# Patient Record
Sex: Female | Born: 1955
Health system: Southern US, Community
[De-identification: ages and names within clinical notes are randomized; demographics above are authoritative.]

## PROBLEM LIST (undated history)

## (undated) DIAGNOSIS — J309 Allergic rhinitis, unspecified: Secondary | ICD-10-CM

## (undated) DIAGNOSIS — T7840XA Allergy, unspecified, initial encounter: Secondary | ICD-10-CM

## (undated) DIAGNOSIS — I10 Essential (primary) hypertension: Secondary | ICD-10-CM

## (undated) DIAGNOSIS — R079 Chest pain, unspecified: Secondary | ICD-10-CM

## (undated) DIAGNOSIS — R1310 Dysphagia, unspecified: Secondary | ICD-10-CM

## (undated) DIAGNOSIS — N289 Disorder of kidney and ureter, unspecified: Secondary | ICD-10-CM

## (undated) DIAGNOSIS — M1990 Unspecified osteoarthritis, unspecified site: Secondary | ICD-10-CM

## (undated) DIAGNOSIS — K5790 Diverticulosis of intestine, part unspecified, without perforation or abscess without bleeding: Secondary | ICD-10-CM

## (undated) DIAGNOSIS — E785 Hyperlipidemia, unspecified: Secondary | ICD-10-CM

## (undated) DIAGNOSIS — F419 Anxiety disorder, unspecified: Secondary | ICD-10-CM

## (undated) DIAGNOSIS — E538 Deficiency of other specified B group vitamins: Secondary | ICD-10-CM

## (undated) HISTORY — DX: Diverticulosis of intestine, part unspecified, without perforation or abscess without bleeding: K57.90

## (undated) HISTORY — PX: OTHER SURGICAL HISTORY: SHX169

## (undated) HISTORY — DX: Anxiety disorder, unspecified: F41.9

## (undated) HISTORY — PX: ABDOMINAL HYSTERECTOMY: SHX81

## (undated) HISTORY — DX: Deficiency of other specified B group vitamins: E53.8

## (undated) HISTORY — PX: APPENDECTOMY: SHX54

## (undated) HISTORY — DX: Allergic rhinitis, unspecified: J30.9

## (undated) HISTORY — DX: Allergy, unspecified, initial encounter: T78.40XA

## (undated) HISTORY — DX: Hyperlipidemia, unspecified: E78.5

## (undated) HISTORY — DX: Essential (primary) hypertension: I10

## (undated) HISTORY — PX: WRIST SURGERY: SHX841

## (undated) HISTORY — PX: CHOLECYSTECTOMY: SHX55

---
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
## 2015-05-16 DIAGNOSIS — J189 Pneumonia, unspecified organism: Secondary | ICD-10-CM | POA: Insufficient documentation

## 2015-05-16 HISTORY — DX: Pneumonia, unspecified organism: J18.9

## 2015-10-14 HISTORY — PX: COLONOSCOPY: SHX174

## 2017-01-04 ENCOUNTER — Other Ambulatory Visit: Payer: Self-pay | Admitting: Nurse Practitioner

## 2017-01-04 DIAGNOSIS — R921 Mammographic calcification found on diagnostic imaging of breast: Secondary | ICD-10-CM

## 2017-01-10 ENCOUNTER — Ambulatory Visit
Admission: RE | Admit: 2017-01-10 | Discharge: 2017-01-10 | Disposition: A | Discharge status: Home / Self Care | Payer: No Typology Code available for payment source | Source: Ambulatory Visit | Attending: Nurse Practitioner | Admitting: Nurse Practitioner

## 2017-01-10 DIAGNOSIS — R921 Mammographic calcification found on diagnostic imaging of breast: Secondary | ICD-10-CM

## 2017-11-30 ENCOUNTER — Encounter: Payer: Self-pay | Admitting: Internal Medicine

## 2019-01-22 ENCOUNTER — Encounter: Payer: Self-pay | Admitting: Cardiology

## 2019-01-22 ENCOUNTER — Ambulatory Visit (INDEPENDENT_AMBULATORY_CARE_PROVIDER_SITE_OTHER): Payer: No Typology Code available for payment source | Admitting: Cardiology

## 2019-01-22 ENCOUNTER — Other Ambulatory Visit: Payer: Self-pay

## 2019-01-22 VITALS — BP 108/62 | HR 64 | Ht 62.0 in | Wt 262.0 lb

## 2019-01-22 DIAGNOSIS — R1310 Dysphagia, unspecified: Secondary | ICD-10-CM

## 2019-01-22 DIAGNOSIS — N289 Disorder of kidney and ureter, unspecified: Secondary | ICD-10-CM

## 2019-01-22 DIAGNOSIS — M1990 Unspecified osteoarthritis, unspecified site: Secondary | ICD-10-CM | POA: Insufficient documentation

## 2019-01-22 DIAGNOSIS — I209 Angina pectoris, unspecified: Secondary | ICD-10-CM | POA: Insufficient documentation

## 2019-01-22 DIAGNOSIS — R079 Chest pain, unspecified: Secondary | ICD-10-CM

## 2019-01-22 DIAGNOSIS — R1314 Dysphagia, pharyngoesophageal phase: Secondary | ICD-10-CM

## 2019-01-22 HISTORY — DX: Chest pain, unspecified: R07.9

## 2019-01-22 HISTORY — DX: Unspecified osteoarthritis, unspecified site: M19.90

## 2019-01-22 HISTORY — DX: Dysphagia, pharyngoesophageal phase: R13.14

## 2019-01-22 HISTORY — DX: Dysphagia, unspecified: R13.10

## 2019-01-22 HISTORY — DX: Morbid (severe) obesity due to excess calories: E66.01

## 2019-01-22 HISTORY — DX: Disorder of kidney and ureter, unspecified: N28.9

## 2019-01-22 MED ORDER — METOPROLOL TARTRATE 50 MG PO TABS: 100.0000 mg | ORAL_TABLET | Freq: Once | ORAL | 0 refills | Status: DC

## 2019-01-22 NOTE — Progress Notes (Signed)
Cardiology Office Note:    Date:  01/22/2019   ID:  Judy Lucas, DOB May 26, 1956, MRN [id number]  PCP:  Nicholos Johns, MD  Cardiologist:  Jenean Lindau, MD   Referring MD: Nicholos Johns, MD    ASSESSMENT:    1. Angina pectoris (Judy Lucas)   2. Renal insufficiency   3. Osteoarthritis, unspecified osteoarthritis type, unspecified site   4. Morbid obesity (Judy Lucas)    PLAN:    In order of problems listed above:  1. Angina pectoris: Her symptoms are very concerning.  She has risk factors for coronary artery disease.  I discussed with her evaluation which is invasive and noninvasive.  I also discussed conventional and CT coronary angiography.  She prefers CT coronary angiography and I discussed details with her at length.  She has an element of renal insufficiency and therefore we will do a repeat Chem-7.  I will also have her hold spironolactone for a day before her procedure.  Benefits and potential risks explained.  Risks to renal function explained in view of renal insufficiency and she vocalized understanding. 2. Essential hypertension: Her blood pressure is stable. 3. Mixed dyslipidemia: This is managed by her primary care physician and she is on statin therapy and diet was discussed.  Risks of obesity was explained and the patient was urged to lose weight 4. She will be seen in follow-up appointment in a month or earlier if she has any concerns.  She knows to go to the nearest emergency room for any significant concerns.   Medication Adjustments/Labs and Tests Ordered: Current medicines are reviewed at length with the patient today.  Concerns regarding medicines are outlined above.  No orders of the defined types were placed in this encounter.  No orders of the defined types were placed in this encounter.    History of Present Illness:    Judy Lucas is a 63 y.o. female who is being seen today for the evaluation of chest discomfort at the request of Nicholos Johns, MD.  Patient is a  pleasant 63 year old female.  She is a Marine scientist by profession.  She mentions to me that she has essential hypertension and dyslipidemia.  She says that she may be exposed to the corona virus in February.  Subsequently she has been treated with multiple courses of antibiotics and is feeling better.  She mentions to me that she when she exerts herself she has chest tightness which goes to the neck.  This happens consistently on exertion.  She also has been noticing some shortness of breath.  At the time of my evaluation, the patient is alert awake oriented and in no distress.  No orthopnea or PND.  She mentions to me that she cannot take nitroglycerin.   Past Medical History:  Diagnosis Date  . Chest pain 01/22/2019  . Dysphagia 01/22/2019  . Osteoarthritis 01/22/2019  . Renal insufficiency 01/22/2019    Past Surgical History:  Procedure Laterality Date  . ABDOMINAL HYSTERECTOMY    . APPENDECTOMY    . CHOLECYSTECTOMY    . feet surgery    . WRIST SURGERY      Current Medications: Current Meds  Medication Sig  . fluticasone (FLONASE) 50 MCG/ACT nasal spray USE TWO SPRAYS IN EACH NOSTRIL EVERY DAY  . loratadine (CLARITIN) 10 MG tablet Take 1 tablet by mouth daily.  . meclizine (ANTIVERT) 12.5 MG tablet Take 12.5 mg by mouth 3 (three) times daily as needed.  . mirabegron ER (MYRBETRIQ) 25 MG TB24 tablet Take 25  mg by mouth daily.  . montelukast (SINGULAIR) 10 MG tablet Take 10 mg by mouth at bedtime.  Marland Kitchen olmesartan (BENICAR) 40 MG tablet Take 40 mg by mouth daily.  Marland Kitchen omeprazole (PRILOSEC) 40 MG capsule Take 40 mg by mouth daily.  . simvastatin (ZOCOR) 10 MG tablet Take 10 mg by mouth daily.  Marland Kitchen spironolactone (ALDACTONE) 50 MG tablet Take 50 mg by mouth 2 (two) times daily.   . [DISCONTINUED] allopurinol (ZYLOPRIM) 300 MG tablet Take 300 mg by mouth daily.  . [DISCONTINUED] Ascorbic Acid (VITAMIN C) 100 MG tablet Take 100 mg by mouth daily.  . [DISCONTINUED] aspirin EC 81 MG tablet Take 81 mg by mouth  daily.  . [DISCONTINUED] Cholecalciferol (VITAMIN D) 50 MCG (2000 UT) tablet Take 2,000 Units by mouth daily.  . [DISCONTINUED] vitamin B-12 (CYANOCOBALAMIN) 500 MCG tablet Take 500 mcg by mouth daily.     Allergies:   Meperidine; Morphine; Thyroid; Furosemide; Nitroglycerin; Penicillin g; and Sulfamethoxazole   Social History   Socioeconomic History  . Marital status: Married    Spouse name: Not on file  . Number of children: Not on file  . Years of education: Not on file  . Highest education level: Not on file  Occupational History  . Not on file  Social Needs  . Financial resource strain: Not on file  . Food insecurity:    Worry: Not on file    Inability: Not on file  . Transportation needs:    Medical: Not on file    Non-medical: Not on file  Tobacco Use  . Smoking status: Never Smoker  . Smokeless tobacco: Never Used  Substance and Sexual Activity  . Alcohol use: Not on file  . Drug use: Not on file  . Sexual activity: Not on file  Lifestyle  . Physical activity:    Days per week: Not on file    Minutes per session: Not on file  . Stress: Not on file  Relationships  . Social connections:    Talks on phone: Not on file    Gets together: Not on file    Attends religious service: Not on file    Active member of club or organization: Not on file    Attends meetings of clubs or organizations: Not on file    Relationship status: Not on file  Other Topics Concern  . Not on file  Social History Narrative  . Not on file     Family History: The patient's family history includes Heart attack in her father and mother; Heart disease in her father and mother; Hyperlipidemia in her brother, father, mother, and sister; Hypertension in her father, mother, and sister; Skin cancer in her father.  ROS:   Please see the history of present illness.    All other systems reviewed and are negative.  EKGs/Labs/Other Studies Reviewed:    The following studies were reviewed today  EKG reveals sinus rhythm and nonspecific ST-T changes   Recent Labs: No results found for requested labs within last 8760 hours.  Recent Lipid Panel No results found for: CHOL, TRIG, HDL, CHOLHDL, VLDL, LDLCALC, LDLDIRECT  Physical Exam:    VS:  BP 108/62 (BP Location: Left Arm, Patient Position: Sitting, Cuff Size: Normal)   Pulse 64   Ht 5\' 2"  (1.575 m)   Wt 262 lb (118.8 kg)   SpO2 98%   BMI 47.92 kg/m     Wt Readings from Last 3 Encounters:  01/22/19 262 lb (118.8 kg)  GEN: Patient is in no acute distress HEENT: Normal NECK: No JVD; No carotid bruits LYMPHATICS: No lymphadenopathy CARDIAC: S1 S2 regular, 2/6 systolic murmur at the apex. RESPIRATORY:  Clear to auscultation without rales, wheezing or rhonchi  ABDOMEN: Soft, non-tender, non-distended MUSCULOSKELETAL:  No edema; No deformity  SKIN: Warm and dry NEUROLOGIC:  Alert and oriented x 3 PSYCHIATRIC:  Normal affect    Signed, Jenean Lindau, MD  01/22/2019 4:06 PM    [facility]

## 2019-01-22 NOTE — Addendum Note (Signed)
Addended by: Beckey Rutter on: 01/22/2019 04:30 PM   Modules accepted: Orders

## 2019-01-22 NOTE — Patient Instructions (Addendum)
Medication Instructions:  Your physician recommends that you continue on your current medications as directed. Please refer to the Current Medication list given to you today.  If you need a refill on your cardiac medications before your next appointment, please call your pharmacy.   Lab work: Your physician recommends that you will have a BMP and CBC drawn today  If you have labs (blood work) drawn today and your tests are completely normal, you will receive your results only by: Marland Kitchen MyChart Message (if you have MyChart) OR . A paper copy in the mail If you have any lab test that is abnormal or we need to change your treatment, we will call you to review the results.  Testing/Procedures: Non-Cardiac CT scanning, (CAT scanning), is a noninvasive, special x-ray that produces cross-sectional images of the body using x-rays and a computer. CT scans help physicians diagnose and treat medical conditions. For some CT exams, a contrast material is used to enhance visibility in the area of the body being studied. CT scans provide greater clarity and reveal more details than regular x-ray exams.  Please arrive at the [facility] main entrance of [facility] at xx:xx AM (30-45 minutes prior to test start time)  [facility] [place], [place] [address] [phone]  Proceed to the [facility] Radiology Department (First Floor).  Please follow these instructions carefully (unless otherwise directed):  On the Night Before the Test: . Be sure to Drink plenty of water. . Do not consume any caffeinated/decaffeinated beverages or chocolate 12 hours prior to your test. . Do not take any antihistamines 12 hours prior to your test.   On the Day of the Test: . Drink plenty of water. Do not drink any water within one hour of the test. . Do not eat any food 4 hours prior to the test. . You may take your regular medications prior to the test.  Take metoprolol (Lopressor)  two hours prior to test.-IF HR is greater than 55 BPM       After the Test: . Drink plenty of water. . After receiving IV contrast, you may experience a mild flushed feeling. This is normal. . On occasion, you may experience a mild rash up to 24 hours after the test. This is not dangerous. If this occurs, you can take Benadryl 25 mg and increase your fluid intake. . If you experience trouble breathing, this can be serious. If it is severe call 911 IMMEDIATELY. If it is mild, please call our office. . If you take any of these medications: Glipizide/Metformin, Avandament, Glucavance, please do not take 48 hours after completing test.  Follow-Up: At [facility], you and your health needs are our priority.  As part of our continuing mission to provide you with exceptional heart care, we have created designated Provider Care Teams.  These Care Teams include your primary Cardiologist (physician) and Advanced Practice Providers (APPs -  Physician Assistants and Nurse Practitioners) who all work together to provide you with the care you need, when you need it. FOLLOW UP IN 30 days with Dr. Geraldo Pitter in the [facility] office  Any Other Special Instructions Will Be Listed Below   Cardiac CT Angiogram  A cardiac CT angiogram is a procedure to look at the heart and the area around the heart. It may be done to help find the cause of chest pains or other symptoms of heart disease. During this procedure, a large X-ray machine, called a CT  scanner, takes detailed pictures of the heart and the surrounding area after a dye (contrast material) has been injected into blood vessels in the area. The procedure is also sometimes called a coronary CT angiogram, coronary artery scanning, or CTA. A cardiac CT angiogram allows the health care provider to see how well blood is flowing to and from the heart. The health care provider will be able to see if there are any problems, such as:  Blockage or narrowing of the  coronary arteries in the heart.  Fluid around the heart.  Signs of weakness or disease in the muscles, valves, and tissues of the heart. Tell a health care provider about:  Any allergies you have. This is especially important if you have had a previous allergic reaction to contrast dye.  All medicines you are taking, including vitamins, herbs, eye drops, creams, and over-the-counter medicines.  Any blood disorders you have.  Any surgeries you have had.  Any medical conditions you have.  Whether you are pregnant or may be pregnant.  Any anxiety disorders, chronic pain, or other conditions you have that may increase your stress or prevent you from lying still. What are the risks? Generally, this is a safe procedure. However, problems may occur, including:  Bleeding.  Infection.  Allergic reactions to medicines or dyes.  Damage to other structures or organs.  Kidney damage from the dye or contrast that is used.  Increased risk of cancer from radiation exposure. This risk is low. Talk with your health care provider about: ? The risks and benefits of testing. ? How you can receive the lowest dose of radiation. What happens before the procedure?  Wear comfortable clothing and remove any jewelry, glasses, dentures, and hearing aids.  Follow instructions from your health care provider about eating and drinking. This may include: ? For 12 hours before the test - avoid caffeine. This includes tea, coffee, soda, energy drinks, and diet pills. Drink plenty of water or other fluids that do not have caffeine in them. Being well-hydrated can prevent complications. ? For 4-6 hours before the test - stop eating and drinking. The contrast dye can cause nausea, but this is less likely if your stomach is empty.  Ask your health care provider about changing or stopping your regular medicines. This is especially important if you are taking diabetes medicines, blood thinners, or medicines to  treat erectile dysfunction. What happens during the procedure?  Hair on your chest may need to be removed so that small sticky patches called electrodes can be placed on your chest. These will transmit information that helps to monitor your heart during the test.  An IV tube will be inserted into one of your veins.  You might be given a medicine to control your heart rate during the test. This will help to ensure that good images are obtained.  You will be asked to lie on an exam table. This table will slide in and out of the CT machine during the procedure.  Contrast dye will be injected into the IV tube. You might feel warm, or you may get a metallic taste in your mouth.  You will be given a medicine (nitroglycerin) to relax (dilate) the arteries in your heart.  The table that you are lying on will move into the CT machine tunnel for the scan.  The person running the machine will give you instructions while the scans are being done. You may be asked to: ? Keep your arms above your head. ?  Hold your breath. ? Stay very still, even if the table is moving.  When the scanning is complete, you will be moved out of the machine.  The IV tube will be removed. The procedure may vary among health care providers and hospitals. What happens after the procedure?  You might feel warm, or you may get a metallic taste in your mouth from the contrast dye.  You may have a headache from the nitroglycerin.  After the procedure, drink water or other fluids to wash (flush) the contrast material out of your body.  Contact a health care provider if you have any symptoms of allergy to the contrast. These symptoms include: ? Shortness of breath. ? Rash or hives. ? A racing heartbeat.  Most people can return to their normal activities right after the procedure. Ask your health care provider what activities are safe for you.  It is up to you to get the results of your procedure. Ask your health care  provider, or the department that is doing the procedure, when your results will be ready. Summary  A cardiac CT angiogram is a procedure to look at the heart and the area around the heart. It may be done to help find the cause of chest pains or other symptoms of heart disease.  During this procedure, a large X-ray machine, called a CT scanner, takes detailed pictures of the heart and the surrounding area after a dye (contrast material) has been injected into blood vessels in the area.  Ask your health care provider about changing or stopping your regular medicines before the procedure. This is especially important if you are taking diabetes medicines, blood thinners, or medicines to treat erectile dysfunction.  After the procedure, drink water or other fluids to wash (flush) the contrast material out of your body. This information is not intended to replace advice given to you by your health care provider. Make sure you discuss any questions you have with your health care provider. Document Released: 07/14/2008 Document Revised: 06/20/2016 Document Reviewed: 06/20/2016 Elsevier Interactive Patient Education  2019 [employer].

## 2019-01-23 LAB — BASIC METABOLIC PANEL
BUN/Creatinine Ratio: 24 (ref 12–28)
BUN: 20 mg/dL (ref 8–27)
CO2: 18 mmol/L — ABNORMAL LOW (ref 20–29)
Calcium: 9.2 mg/dL (ref 8.7–10.3)
Chloride: 103 mmol/L (ref 96–106)
Creatinine, Ser: 0.82 mg/dL (ref 0.57–1.00)
GFR calc Af Amer: 87 mL/min/{1.73_m2} (ref >59)
GFR calc non Af Amer: 76 mL/min/{1.73_m2} (ref >59)
Glucose: 94 mg/dL (ref 65–99)
Potassium: 4.3 mmol/L (ref 3.5–5.2)
Sodium: 139 mmol/L (ref 134–144)

## 2019-01-23 LAB — CBC
Hematocrit: 32.5 % — ABNORMAL LOW (ref 34.0–46.6)
Hemoglobin: 11.1 g/dL (ref 11.1–15.9)
MCH: 32.9 pg (ref 26.6–33.0)
MCHC: 34.2 g/dL (ref 31.5–35.7)
MCV: 96 fL (ref 79–97)
Platelets: 213 10*3/uL (ref 150–450)
RBC: 3.37 x10E6/uL — ABNORMAL LOW (ref 3.77–5.28)
RDW: 13.7 % (ref 11.7–15.4)
WBC: 6.3 10*3/uL (ref 3.4–10.8)

## 2019-01-24 ENCOUNTER — Encounter: Payer: Self-pay | Admitting: Gastroenterology

## 2019-01-24 ENCOUNTER — Telehealth: Payer: Self-pay

## 2019-01-24 DIAGNOSIS — N289 Disorder of kidney and ureter, unspecified: Secondary | ICD-10-CM

## 2019-01-24 NOTE — Telephone Encounter (Signed)
Information relayed to patient, she is in agreement with plan. She will come in within 1 wk of CT to have repeat BMP drawn. No further questions at this time.

## 2019-01-24 NOTE — Telephone Encounter (Signed)
-----   Message from Jenean Lindau, MD sent at 01/23/2019 10:07 AM EDT ----- The results of the study is unremarkable.  Please let me know when her CT coronary angiography is scheduled for.  Please tell the patient to hold Spironolactone the day previous to the CT scan and encourage hydration on the previous day and the day of the CT scan after the test is done.  I would like her to get a Chem-7 3 to 4 days after the CT scan.  Please inform patient. I will discuss in detail at next appointment. Cc  primary care/referring physician Jenean Lindau, MD 01/23/2019 10:06 AM

## 2019-01-29 NOTE — Addendum Note (Signed)
Addended by: Beckey Rutter on: 01/29/2019 08:34 AM   Modules accepted: Orders

## 2019-02-02 ENCOUNTER — Telehealth (HOSPITAL_COMMUNITY): Payer: Self-pay | Admitting: Emergency Medicine

## 2019-02-02 NOTE — Telephone Encounter (Signed)
Unable to leave voicemail, box not set up

## 2019-02-04 ENCOUNTER — Ambulatory Visit (HOSPITAL_COMMUNITY)
Admission: RE | Admit: 2019-02-04 | Discharge: 2019-02-04 | Disposition: A | Discharge status: Home / Self Care | Payer: PRIVATE HEALTH INSURANCE | Source: Ambulatory Visit | Attending: Cardiology | Admitting: Cardiology

## 2019-02-04 ENCOUNTER — Ambulatory Visit (HOSPITAL_COMMUNITY): Admission: RE | Admit: 2019-02-04 | Payer: PRIVATE HEALTH INSURANCE | Source: Ambulatory Visit

## 2019-02-04 ENCOUNTER — Other Ambulatory Visit: Payer: Self-pay

## 2019-02-04 ENCOUNTER — Encounter (HOSPITAL_COMMUNITY): Payer: Self-pay

## 2019-02-04 ENCOUNTER — Telehealth (HOSPITAL_COMMUNITY): Payer: Self-pay | Admitting: Emergency Medicine

## 2019-02-04 DIAGNOSIS — Z006 Encounter for examination for normal comparison and control in clinical research program: Secondary | ICD-10-CM

## 2019-02-04 DIAGNOSIS — I209 Angina pectoris, unspecified: Secondary | ICD-10-CM | POA: Insufficient documentation

## 2019-02-04 MED ORDER — NITROGLYCERIN 0.4 MG SL SUBL
0.8000 mg | SUBLINGUAL_TABLET | Freq: Once | SUBLINGUAL | Status: AC
  Administered 2019-02-04: 14:00:00 0.8 mg via SUBLINGUAL
  Filled 2019-02-04: qty 25

## 2019-02-04 MED ORDER — NITROGLYCERIN 0.4 MG SL SUBL
SUBLINGUAL_TABLET | SUBLINGUAL | Status: AC
  Administered 2019-02-04: 0.8 mg via SUBLINGUAL
  Filled 2019-02-04: qty 2

## 2019-02-04 MED ORDER — IOHEXOL 350 MG/ML SOLN
100.0000 mL | Freq: Once | INTRAVENOUS | Status: AC | PRN
  Administered 2019-02-04: 80 mL via INTRAVENOUS

## 2019-02-04 NOTE — Discharge Instructions (Signed)
What You Need to Know About IV Contrast Material °IV contrast material is most often a fluid that is used with some imaging tests. Contrast material is injected into your body through a vein to help your health care providers see your organs and tissues more clearly. It may be used with: °· X-ray. °· MRI. °· CT. °· Ultrasound. °Contrast material is used when your health care providers need a detailed look at organs, tissues, or blood vessels that may not show up with the standard test. IV contrast may be used for imaging tests that examine: °· Muscles, skin, and fat. °· Breasts. °· Brain. °· Digestive tract. °· Heart. °· Liver. °· Lungs and many other internal organs. °What are the risks of using IV contrast material? °The risks of using IV contrast material include: °· Headache. °· Itching, skin rash, and hives. °· Allergic reactions. °· Nausea and vomiting. °· Wheezing or difficulty breathing. °· Abnormal heart rate. °· Blood pressure changes. °· Throat swelling. °· Kidney damage. °These complications are more likely to occur in people who: °· Have kidney failure. °· Have liver problems. °· Have certain heart problems, including: °? Heart failure. °? Heart attack. °? Heart infection. °? Heart valve problems. °· Abuse alcohol. °· Have allergies or asthma. °· Are dehydrated. °· Have sickle cell anemia or similar problems. °· Have had trouble with IV contrast material in the past. °· Take certain medicines, such as: °? Metformin. °? NSAIDs. °? Beta blockers. °? Interleukin-2. °How do I prepare for my test with IV contrast material? °· Follow instructions from your health care provider about eating or drinking restrictions. °· Ask your health care provider about changing or stopping your regular medicines. This is especially important if you are taking diabetes medicines or blood thinners. °· Tell your health care provider about: °? Any previous illnesses, surgeries, or pre-existing medical conditions. °? Whether you  are pregnant or may be pregnant. °? Whether you are breastfeeding. Most contrast agents are safe for use in breastfeeding women. °· You may have a physical exam to determine any potential risks. °· Ask if you will be given a medicine (sedative) to help you relax during the procedure. If so, plan to have someone take you home after test. °What happens during the test with IV contrast material? ° °· You may be given a sedative to help you relax. °· A needle will be inserted into one of your veins to administer the IV contrast material. °· You may feel warmth or flushing as the material enters your bloodstream. °· You may have a metallic taste in your mouth for a few minutes. °· The needle may cause some discomfort and bruising. °· After the contrast material is in your body, the imaging test will be done. °The procedure may vary among health care providers and hospitals. °What happens after the test with IV contrast material? °· You may be asked to drink water or other fluids to wash (flush) the contrast material out of your body. °· Drink enough fluid to keep your urine pale yellow. °· Do not drive for 24 hours if you received a sedative. °· It is your responsibility to get your test results. Ask your health care provider or the department performing the test when your results will be ready. °Contact a health care provider if: °· You have redness, swelling, or pain near your IV site. °Get help right away if: °· You have an abnormal heart rhythm. °· You have trouble breathing. °· You have: °?   Chest pain. ? Pain in your back, neck, arm, jaw, or stomach. ? Nausea or sweating. ? Hives or a rash.  You start shaking and cannot stop. These symptoms may represent a serious problem that is an emergency. Do not wait to see if the symptoms will go away. Get medical help right away. Call your local emergency services (911 in the [place] Do not drive yourself to the hospital. Summary  IV contrast may be used for imaging  tests to help your health care providers see your organs and tissues more clearly.  Tell your health care provider if you are pregnant or may be pregnant.  During the procedure, you may feel warmth or flushing as the material enters your bloodstream.  After the procedure, drink enough fluid to keep your urine pale yellow. This information is not intended to replace advice given to you by your health care provider. Make sure you discuss any questions you have with your health care provider. Document Released: 07/20/2009 Document Revised: 03/26/2018 Document Reviewed: 04/08/2015 Elsevier Interactive Patient Education  2019 [employer].   Cardiac CT Angiogram  A cardiac CT angiogram is a procedure to look at the heart and the area around the heart. It may be done to help find the cause of chest pains or other symptoms of heart disease. During this procedure, a large X-ray machine, called a CT scanner, takes detailed pictures of the heart and the surrounding area after a dye (contrast material) has been injected into blood vessels in the area. The procedure is also sometimes called a coronary CT angiogram, coronary artery scanning, or CTA. A cardiac CT angiogram allows the health care provider to see how well blood is flowing to and from the heart. The health care provider will be able to see if there are any problems, such as:  Blockage or narrowing of the coronary arteries in the heart.  Fluid around the heart.  Signs of weakness or disease in the muscles, valves, and tissues of the heart. Tell a health care provider about:  Any allergies you have. This is especially important if you have had a previous allergic reaction to contrast dye.  All medicines you are taking, including vitamins, herbs, eye drops, creams, and over-the-counter medicines.  Any blood disorders you have.  Any surgeries you have had.  Any medical conditions you have.  Whether you are pregnant or may be  pregnant.  Any anxiety disorders, chronic pain, or other conditions you have that may increase your stress or prevent you from lying still. What are the risks? Generally, this is a safe procedure. However, problems may occur, including:  Bleeding.  Infection.  Allergic reactions to medicines or dyes.  Damage to other structures or organs.  Kidney damage from the dye or contrast that is used.  Increased risk of cancer from radiation exposure. This risk is low. Talk with your health care provider about: ? The risks and benefits of testing. ? How you can receive the lowest dose of radiation. What happens before the procedure?  Wear comfortable clothing and remove any jewelry, glasses, dentures, and hearing aids.  Follow instructions from your health care provider about eating and drinking. This may include: ? For 12 hours before the test -- avoid caffeine. This includes tea, coffee, soda, energy drinks, and diet pills. Drink plenty of water or other fluids that do not have caffeine in them. Being well-hydrated can prevent complications. ? For 4-6 hours before the test -- stop eating and drinking. The  contrast dye can cause nausea, but this is less likely if your stomach is empty.  Ask your health care provider about changing or stopping your regular medicines. This is especially important if you are taking diabetes medicines, blood thinners, or medicines to treat erectile dysfunction. What happens during the procedure?  Hair on your chest may need to be removed so that small sticky patches called electrodes can be placed on your chest. These will transmit information that helps to monitor your heart during the test.  An IV tube will be inserted into one of your veins.  You might be given a medicine to control your heart rate during the test. This will help to ensure that good images are obtained.  You will be asked to lie on an exam table. This table will slide in and out of the CT  machine during the procedure.  Contrast dye will be injected into the IV tube. You might feel warm, or you may get a metallic taste in your mouth.  You will be given a medicine (nitroglycerin) to relax (dilate) the arteries in your heart.  The table that you are lying on will move into the CT machine tunnel for the scan.  The person running the machine will give you instructions while the scans are being done. You may be asked to: ? Keep your arms above your head. ? Hold your breath. ? Stay very still, even if the table is moving.  When the scanning is complete, you will be moved out of the machine.  The IV tube will be removed. The procedure may vary among health care providers and hospitals. What happens after the procedure?  You might feel warm, or you may get a metallic taste in your mouth from the contrast dye.  You may have a headache from the nitroglycerin.  After the procedure, drink water or other fluids to wash (flush) the contrast material out of your body.  Contact a health care provider if you have any symptoms of allergy to the contrast. These symptoms include: ? Shortness of breath. ? Rash or hives. ? A racing heartbeat.  Most people can return to their normal activities right after the procedure. Ask your health care provider what activities are safe for you.  It is up to you to get the results of your procedure. Ask your health care provider, or the department that is doing the procedure, when your results will be ready. Summary  A cardiac CT angiogram is a procedure to look at the heart and the area around the heart. It may be done to help find the cause of chest pains or other symptoms of heart disease.  During this procedure, a large X-ray machine, called a CT scanner, takes detailed pictures of the heart and the surrounding area after a dye (contrast material) has been injected into blood vessels in the area.  Ask your health care provider about changing or  stopping your regular medicines before the procedure. This is especially important if you are taking diabetes medicines, blood thinners, or medicines to treat erectile dysfunction.  After the procedure, drink water or other fluids to wash (flush) the contrast material out of your body. This information is not intended to replace advice given to you by your health care provider. Make sure you discuss any questions you have with your health care provider. Document Released: 07/14/2008 Document Revised: 06/20/2016 Document Reviewed: 06/20/2016 Elsevier Interactive Patient Education  2019 [employer].

## 2019-02-04 NOTE — Progress Notes (Signed)
Pt tolerated Nitro without incident.  BP decreased by 10 pts, pt states her BP runs low.  Denies dizziness or lightheadedness.  Pt C/O slight headache.  Caffeinated beverage and crackers given to patient.  Pt states headache is slightly improved and is ready to leave.  Pt tolerates standing up and ambulating without incident.  PIV removed and dressing applied.  Discharge instructions discussed with the patient, pt verbalizes understanding.  Pt discharged

## 2019-02-04 NOTE — Research (Signed)
Judy Lucas met inclusion and exclusion criteria.  The informed consent form, study requirements and expectations were reviewed with the subject and questions and concerns were addressed prior to the signing of the consent form.  The subject verbalized understanding of the trial requirements.  The subject agreed to participate in the CADFEM trial and signed the informed consent.  The informed consent was obtained prior to performance of any protocol-specific procedures for the subject.  A copy of the signed informed consent was given to the subject and a copy was placed in the subject's medical record.   Dionne Bucy. [name], Naval architect

## 2019-02-04 NOTE — Telephone Encounter (Signed)
Calling to clarify nitro "allergy" says years ago she was given a breathing treatment to for asthma and her HR was close to 200 bpm so they gave her nitro to lower it...  RN Navigator explained CCTA procedure and medications used, pt OK with getting contrast and nitro for CCTA today. Pt also has home pulse ox meter which she checks her HR with, while on phone HR was 48-54bpm, pt verbalized understanding of medication admin instructions on when to take PO metoprolol and when not to take PO metoprolol.  Pt denied any further questions, was given my phone number for follow up if needed. Denies covid symptoms  Marchia Bond RN Navigator Cardiac Imaging [facility] [phone] Office  [phone] Cell

## 2019-02-05 ENCOUNTER — Telehealth: Payer: Self-pay

## 2019-02-05 NOTE — Telephone Encounter (Signed)
-----   Message from Jenean Lindau, MD sent at 02/05/2019  8:54 AM EDT ----- I tried to leave her message but her phone cannot accept voicemails.  Her CT scan report is completely normal.  Please let her know and please send a copy to primary care.  The results of the study is unremarkable. Please inform patient. I will discuss in detail at next appointment. Cc  primary care/referring physician Jenean Lindau, MD 02/05/2019 8:54 AM

## 2019-02-05 NOTE — Telephone Encounter (Signed)
Left vm on home phone that results were good. Call office back for further information. Copy sent to Dr. Rica Records per Dr. Docia Furl request.

## 2019-02-20 ENCOUNTER — Encounter: Payer: Self-pay | Admitting: Cardiology

## 2019-02-20 ENCOUNTER — Other Ambulatory Visit: Payer: Self-pay

## 2019-02-20 ENCOUNTER — Ambulatory Visit (INDEPENDENT_AMBULATORY_CARE_PROVIDER_SITE_OTHER): Payer: PRIVATE HEALTH INSURANCE | Admitting: Cardiology

## 2019-02-20 VITALS — BP 126/72 | HR 58 | Ht 62.0 in | Wt 260.0 lb

## 2019-02-20 DIAGNOSIS — R0789 Other chest pain: Secondary | ICD-10-CM

## 2019-02-20 DIAGNOSIS — N289 Disorder of kidney and ureter, unspecified: Secondary | ICD-10-CM

## 2019-02-20 HISTORY — DX: Other chest pain: R07.89

## 2019-02-20 NOTE — Progress Notes (Signed)
Cardiology Office Note:    Date:  02/20/2019   ID:  Judy Lucas, DOB 1955/11/09, MRN [id number]  PCP:  Judy Johns, MD  Cardiologist:  Judy Lindau, MD   Referring MD: Judy Johns, MD    ASSESSMENT:    1. Renal insufficiency   2. Morbid obesity ([place])   3. Chest discomfort    PLAN:    In order of problems listed above:  1. I discussed my findings with the patient at extensive length.  Her coronary evaluation was unremarkable.  Findings are noted below.  Details are also noted below.  In view of the fact that she has minor aortic arch calcifications we will continue her statins.  Importance of compliance with diet and medication stressed. 2. Essential hypertension: Blood pressure stable.  Diet was discussed. 3. Morbid obesity: Risks of obesity explained and weight reduction was stressed and she agrees to do better. 4. Patient will be seen in follow-up appointment in 6 months or earlier if the patient has any concerns 5. In the noncardiac evaluation of the CT scan the radiologist noted atelectasis and an incompletely visualized area in the left lower lobe of the lung.  Patient needs a follow-up noncontrast CT of the chest in 3 months and this will be done by her primary care physician.  I discussed this with the patient at extensive length.  We will send a complete report of her CT scan to her primary care physician.    Medication Adjustments/Labs and Tests Ordered: Current medicines are reviewed at length with the patient today.  Concerns regarding medicines are outlined above.  No orders of the defined types were placed in this encounter.  No orders of the defined types were placed in this encounter.    No chief complaint on file.    History of Present Illness:    Judy Lucas is a 63 y.o. female.  Patient was evaluated by me for chest discomfort.  Her CT calcium score was 0 and testing was unremarkable.  She had only mild calcification in the aortic arch.  She is very  happy to know about those results.  She leads a sedentary lifestyle.     EXAM: Cardiac/Coronary  CT  TECHNIQUE: The patient was scanned on a [employer].  FINDINGS: A 120 kV prospective scan was triggered in the descending thoracic aorta at 111 HU's. Axial non-contrast 3 mm slices were carried out through the heart. The data set was analyzed on a dedicated work station and scored using the [place]. Gantry rotation speed was 250 msecs and collimation was .6 mm. 50 mg of PO Metoprolol and 0.8 mg of sl NTG was given. The 3D data set was reconstructed in 5% intervals of the 67-82 % of the R-R cycle. Diastolic phases were analyzed on a dedicated work station using MPR, MIP and VRT modes. The patient received 80 cc of contrast.  Aorta: Normal size. Mild calcifications in the aortic arch calcifications. No dissection.  Aortic Valve:  Trileaflet.  No calcifications.  Coronary Arteries:  Normal coronary origin.  Right dominance.  RCA is a large dominant artery that gives rise to PDA and PLVB. There is no plaque.  Left main is a large artery that gives rise to LAD and LCX arteries. Left main has no plaque.  LAD is a large vessel that has minimal irregularities.  LCX is a non-dominant artery that gives rise to one large OM1 branch. There is no plaque.  Other findings:  Normal  pulmonary vein drainage into the left atrium.  Normal let atrial appendage without a thrombus.  Normal size of the pulmonary artery.  IMPRESSION: 1. Coronary calcium score of 0. This was 0 percentile for age and sex matched control.  2. Normal coronary origin with right dominance.  3. Study quality affected by patient's size, however tere is no evidence of CAD.   Electronically Signed   By: Judy Lucas   On: 02/04/2019 21:32   Addended by Judy Spark, MD on 02/04/2019 9:35 PM    Study Result  EXAM: OVER-READ INTERPRETATION  CT CHEST  The  following report is an over-read performed by radiologist Dr. Misty Lucas of [facility], [place] on 02/04/2019. This over-read does not include interpretation of cardiac or coronary anatomy or pathology. The coronary CTA interpretation by the cardiologist is attached.  COMPARISON:  None.  FINDINGS: Vascular: No substantial pericardial effusion. Atherosclerotic calcification is noted in the wall of the thoracic aorta. No thoracic aortic aneurysm.  Mediastinum/Nodes: No mediastinal lymphadenopathy. There is no hilar lymphadenopathy. The esophagus has normal imaging features. There is no axillary lymphadenopathy.  Lungs/Pleura: No suspicious pulmonary nodule or mass within the visualized lung parenchyma. Subpleural consolidative opacity seen in the left lower lobe (47/12) measures 2.7 x 1.0 cm and is compatible with atelectasis or infiltrate. No pleural effusion within the visualized thorax.  Upper Abdomen: Unremarkable.  Musculoskeletal: No worrisome lytic or sclerotic osseous abnormality.  IMPRESSION: 1. Small area of subpleural collapse/consolidation in the left lower lobe. Imaging features are likely secondary to atelectasis or pneumonia but this region has been incompletely visualized. Given the relatively focal appearance, follow-up noncontrast CT chest in 3 months to ensure resolution.  These results will be called to the ordering clinician or representative by the Radiologist Assistant, and communication documented in the PACS or zVision Dashboard.  Electronically Signed: By: Judy Lucas M.D. On: 02/04/2019 15:10     Result History  CT CORONARY MORPH W/CTA COR W/SCORE W/CA W/CM &/OR WO/CM (Order #[id number]) on 02/04/2019 - Order Result History Report - Result Edited     Past Medical History:  Diagnosis Date  . Chest pain 01/22/2019  . Dysphagia 01/22/2019  . Osteoarthritis 01/22/2019  . Renal insufficiency 01/22/2019    Past Surgical History:   Procedure Laterality Date  . ABDOMINAL HYSTERECTOMY    . APPENDECTOMY    . CHOLECYSTECTOMY    . feet surgery    . WRIST SURGERY      Current Medications: Current Meds  Medication Sig  . allopurinol (ZYLOPRIM) 300 MG tablet Take 150 mg by mouth See admin instructions. Takes 150mg  every other day (300mg  tablets, pt splints in half and takes half every other day)  . fluticasone (FLONASE) 50 MCG/ACT nasal spray USE TWO SPRAYS IN EACH NOSTRIL EVERY DAY  . loratadine (CLARITIN) 10 MG tablet Take 1 tablet by mouth daily.  . meclizine (ANTIVERT) 12.5 MG tablet Take 12.5 mg by mouth 3 (three) times daily as needed.  . mirabegron ER (MYRBETRIQ) 25 MG TB24 tablet Take 25 mg by mouth daily.  . montelukast (SINGULAIR) 10 MG tablet Take 10 mg by mouth at bedtime.  Marland Kitchen olmesartan (BENICAR) 40 MG tablet Take 40 mg by mouth daily.  Marland Kitchen omeprazole (PRILOSEC) 40 MG capsule Take 40 mg by mouth daily.  . simvastatin (ZOCOR) 10 MG tablet Take 10 mg by mouth daily.  Marland Kitchen spironolactone (ALDACTONE) 50 MG tablet Take 50 mg by mouth 2 (two) times daily.  Allergies:   Meperidine, Morphine, Yellow dyes (non-tartrazine), Thyroid, Furosemide, Nitroglycerin, Penicillin g, and Sulfamethoxazole   Social History   Socioeconomic History  . Marital status: Married    Spouse name: Not on file  . Number of children: Not on file  . Years of education: Not on file  . Highest education level: Not on file  Occupational History  . Not on file  Social Needs  . Financial resource strain: Not on file  . Food insecurity    Worry: Not on file    Inability: Not on file  . Transportation needs    Medical: Not on file    Non-medical: Not on file  Tobacco Use  . Smoking status: Never Smoker  . Smokeless tobacco: Never Used  Substance and Sexual Activity  . Alcohol use: Not on file  . Drug use: Not on file  . Sexual activity: Not on file  Lifestyle  . Physical activity    Days per week: Not on file    Minutes per  session: Not on file  . Stress: Not on file  Relationships  . Social Herbalist on phone: Not on file    Gets together: Not on file    Attends religious service: Not on file    Active member of club or organization: Not on file    Attends meetings of clubs or organizations: Not on file    Relationship status: Not on file  Other Topics Concern  . Not on file  Social History Narrative  . Not on file     Family History: The patient's family history includes Heart attack in her father and mother; Heart disease in her father and mother; Hyperlipidemia in her brother, father, mother, and sister; Hypertension in her father, mother, and sister; Skin cancer in her father.  ROS:   Please see the history of present illness.    All other systems reviewed and are negative.  EKGs/Labs/Other Studies Reviewed:    The following studies were reviewed today:  IMPRESSION: 1. Coronary calcium score of 0. This was 0 percentile for age and sex matched control.  2. Normal coronary origin with right dominance.  3. Study quality affected by patient's size, however tere is no evidence of CAD.   Electronically Signed   By: Judy Lucas   On: 02/04/2019 21:32  Recent Labs: 01/22/2019: BUN 20; Creatinine, Ser 0.82; Hemoglobin 11.1; Platelets 213; Potassium 4.3; Sodium 139  Recent Lipid Panel No results found for: CHOL, TRIG, HDL, CHOLHDL, VLDL, LDLCALC, LDLDIRECT  Physical Exam:    VS:  BP 126/72 (BP Location: Left Arm, Patient Position: Sitting, Cuff Size: Normal)   Pulse (!) 58   Ht 5\' 2"  (1.575 m)   Wt 260 lb (117.9 kg)   SpO2 98%   BMI 47.55 kg/m     Wt Readings from Last 3 Encounters:  02/20/19 260 lb (117.9 kg)  01/22/19 262 lb (118.8 kg)     GEN: Patient is in no acute distress HEENT: Normal NECK: No JVD; No carotid bruits LYMPHATICS: No lymphadenopathy CARDIAC: Hear sounds regular, 2/6 systolic murmur at the apex. RESPIRATORY:  Clear to auscultation  without rales, wheezing or rhonchi  ABDOMEN: Soft, non-tender, non-distended MUSCULOSKELETAL:  No edema; No deformity  SKIN: Warm and dry NEUROLOGIC:  Alert and oriented x 3 PSYCHIATRIC:  Normal affect   Signed, Judy Lindau, MD  02/20/2019 4:22 PM    [facility]

## 2019-02-20 NOTE — Patient Instructions (Addendum)

## 2019-02-21 ENCOUNTER — Telehealth: Payer: Self-pay

## 2019-02-21 ENCOUNTER — Ambulatory Visit: Payer: PRIVATE HEALTH INSURANCE | Admitting: Cardiology

## 2019-02-21 NOTE — Telephone Encounter (Signed)
Patient was advised of pleural density noted on CT and informed that Dr. Rica Records will evaluate for follow up ct in 3 mo. Copy of CT result sent to Dr. Rica Records per Dr. Docia Furl request.

## 2019-03-29 ENCOUNTER — Telehealth (HOSPITAL_COMMUNITY): Payer: Self-pay | Admitting: Rehabilitation

## 2019-03-29 ENCOUNTER — Other Ambulatory Visit (HOSPITAL_COMMUNITY): Payer: Self-pay | Admitting: Family Medicine

## 2019-03-29 DIAGNOSIS — R609 Edema, unspecified: Secondary | ICD-10-CM

## 2019-03-29 DIAGNOSIS — M79662 Pain in left lower leg: Secondary | ICD-10-CM

## 2019-03-29 NOTE — Telephone Encounter (Signed)

## 2019-04-01 ENCOUNTER — Ambulatory Visit (HOSPITAL_COMMUNITY)
Admission: RE | Admit: 2019-04-01 | Discharge: 2019-04-01 | Disposition: A | Discharge status: Home / Self Care | Payer: PRIVATE HEALTH INSURANCE | Source: Ambulatory Visit | Attending: Family | Admitting: Family

## 2019-04-01 ENCOUNTER — Other Ambulatory Visit: Payer: Self-pay

## 2019-04-01 DIAGNOSIS — M79662 Pain in left lower leg: Secondary | ICD-10-CM

## 2019-04-02 ENCOUNTER — Ambulatory Visit (HOSPITAL_COMMUNITY)
Admission: RE | Admit: 2019-04-02 | Discharge: 2019-04-02 | Disposition: A | Discharge status: Home / Self Care | Payer: PRIVATE HEALTH INSURANCE | Source: Ambulatory Visit | Attending: Family | Admitting: Family

## 2019-04-02 DIAGNOSIS — R609 Edema, unspecified: Secondary | ICD-10-CM | POA: Diagnosis present

## 2019-04-02 DIAGNOSIS — M79662 Pain in left lower leg: Secondary | ICD-10-CM

## 2019-05-31 HISTORY — PX: COLONOSCOPY: SHX174

## 2019-05-31 HISTORY — PX: ESOPHAGOGASTRODUODENOSCOPY: SHX1529

## 2019-06-04 ENCOUNTER — Encounter: Payer: Self-pay | Admitting: Vascular Surgery

## 2019-06-04 ENCOUNTER — Other Ambulatory Visit: Payer: Self-pay

## 2019-06-04 ENCOUNTER — Ambulatory Visit: Payer: PRIVATE HEALTH INSURANCE | Admitting: Vascular Surgery

## 2019-06-04 VITALS — BP 98/72 | HR 52 | Temp 97.9°F | Resp 20 | Ht 62.0 in | Wt 252.7 lb

## 2019-06-04 DIAGNOSIS — I83893 Varicose veins of bilateral lower extremities with other complications: Secondary | ICD-10-CM | POA: Diagnosis not present

## 2019-06-04 NOTE — Progress Notes (Signed)
[facility] [place]  Patient name: Judy Lucas MRN: [id number] DOB: Jan 08, 1956 Sex: female  REASON FOR CONSULT: Evaluation bilateral lower extremity venous stasis disease left greater than right  HPI: Judy Lucas is a 63 y.o. female, who is here today for evaluation.  She has severe bilateral venous stasis disease left greater than right.  She has had no DVT.  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  She is obese and does wear panty style compression and also knee-high graduated compression stockings while working.  She does have extensive telangiectasia and has had no history of bleeding.  She has had progressive skin changes in her left ankle is here for further evaluation.  She reports that tired achy sensation at the end of the day when on her feet for a great period of the day  Past Medical History:  Diagnosis Date  . Allergy   . Chest pain 01/22/2019  . Dysphagia 01/22/2019  . Osteoarthritis 01/22/2019  . Renal insufficiency 01/22/2019    Family History  Problem Relation Age of Onset  . Hyperlipidemia Mother   . Hypertension Mother   . Heart disease Mother   . Heart attack Mother   . Skin cancer Father   . Hyperlipidemia Father   . Hypertension Father   . Heart disease Father   . Heart attack Father   . Hypertension Sister   . Hyperlipidemia Sister   . Hyperlipidemia Brother     SOCIAL HISTORY: Social History   Socioeconomic History  . Marital status: Married    Spouse name: Not on file  . Number of children: Not on file  . Years of education: Not on file  . Highest education level: Not on file  Occupational History  . Not on file  Social Needs  . Financial resource strain: Not on file  . Food insecurity    Worry: Not on file    Inability: Not on file  . Transportation needs    Medical: Not on file    Non-medical: Not on file  Tobacco Use  . Smoking  status: Never Smoker  . Smokeless tobacco: Never Used  Substance and Sexual Activity  . Alcohol use: Never    Frequency: Never  . Drug use: Never  . Sexual activity: Not on file  Lifestyle  . Physical activity    Days per week: Not on file    Minutes per session: Not on file  . Stress: Not on file  Relationships  . Social Herbalist on phone: Not on file    Gets together: Not on file    Attends religious service: Not on file    Active member of club or organization: Not on file    Attends meetings of clubs or organizations: Not on file    Relationship status: Not on file  . Intimate partner violence    Fear of current or ex partner: Not on file    Emotionally abused: Not on file    Physically abused: Not on file    Forced sexual activity: Not on file  Other Topics Concern  . Not on file  Social History Narrative  . Not on file    Allergies  Allergen Reactions  . Meperidine Shortness Of Breath  . Morphine Shortness Of Breath  . Yellow Dyes (Non-Tartrazine) Anaphylaxis  . Thyroid Swelling  . Furosemide Rash    SWELLING ALLERGY SWELLING ALLERGY   .  Nitroglycerin Palpitations  . Penicillin G Rash  . Sulfamethoxazole Rash    Current Outpatient Medications  Medication Sig Dispense Refill  . allopurinol (ZYLOPRIM) 300 MG tablet Take 150 mg by mouth See admin instructions. Takes 150mg  every other day (300mg  tablets, pt splints in half and takes half every other day)    . fluticasone (FLONASE) 50 MCG/ACT nasal spray USE TWO SPRAYS IN EACH NOSTRIL EVERY DAY    . loratadine (CLARITIN) 10 MG tablet Take 1 tablet by mouth daily.    . meclizine (ANTIVERT) 12.5 MG tablet Take 12.5 mg by mouth 3 (three) times daily as needed.    . mirabegron ER (MYRBETRIQ) 25 MG TB24 tablet Take 25 mg by mouth daily.    . montelukast (SINGULAIR) 10 MG tablet Take 10 mg by mouth at bedtime.    Marland Kitchen olmesartan (BENICAR) 40 MG tablet Take 40 mg by mouth daily.    Marland Kitchen omeprazole (PRILOSEC) 40  MG capsule Take 40 mg by mouth daily.    . simvastatin (ZOCOR) 10 MG tablet Take 10 mg by mouth daily.    Marland Kitchen spironolactone (ALDACTONE) 50 MG tablet Take 50 mg by mouth 2 (two) times daily.      No current facility-administered medications for this visit.     REVIEW OF SYSTEMS:  [X]  denotes positive finding, [ ]  denotes negative finding Cardiac  Comments:  Chest pain or chest pressure:    Shortness of breath upon exertion: x   Short of breath when lying flat:    Irregular heart rhythm: x       Vascular    Pain in calf, thigh, or hip brought on by ambulation:    Pain in feet at night that wakes you up from your sleep:  x   Blood clot in your veins:    Leg swelling:  x       Pulmonary    Oxygen at home:    Productive cough:     Wheezing:         Neurologic    Sudden weakness in arms or legs:     Sudden numbness in arms or legs:     Sudden onset of difficulty speaking or slurred speech:    Temporary loss of vision in one eye:     Problems with dizziness:         Gastrointestinal    Blood in stool:     Vomited blood:         Genitourinary    Burning when urinating:     Blood in urine:        Psychiatric    Major depression:         Hematologic    Bleeding problems:    Problems with blood clotting too easily:        Skin    Rashes or ulcers:        Constitutional    Fever or chills:      PHYSICAL EXAM: Vitals:   06/04/19 0911  BP: 98/72  Pulse: (!) 52  Resp: 20  Temp: 97.9 F (36.6 C)  SpO2: 97%  Weight: 114.6 kg  Height: 5\' 2"  (1.575 m)    GENERAL: The patient is a well-nourished female, in no acute distress. The vital signs are documented above. CARDIOVASCULAR: 2+ dorsalis pedis pulses bilaterally PULMONARY: There is good air exchange  ABDOMEN: Soft and non-tender  MUSCULOSKELETAL: There are no major deformities or cyanosis. NEUROLOGIC: No focal weakness or paresthesias are detected. SKIN:  There are no ulcers or rashes noted.  Marked changes of  venous insufficiency in her left leg with hemosiderin deposit and thickening.  Corona  phlebectasia at her ankle levels bilaterally  PSYCHIATRIC: The patient has a normal affect.  DATA:  She had undergone formal duplex of her left leg in August and I reviewed this with her.  Also imaged her left saphenous and right saphenous vein.  She is obese and her saphenous vein does run deep but she does have an enlarged saphenous vein with reflux throughout her left thigh.  She did not have a formal venous duplex in the past but I imaged and she does have dilatation of her right saphenous vein as well  MEDICAL ISSUES: Venous stasis disease with venous hypertension bilaterally left greater than right. CEAP class IV I again stressed the importance of elevation with her heart higher than her legs.  Explained the critical importance of daily compression garments.  Also explained that weight loss would benefit her.  She will return in 3 months to determine if these conservative methods are helping.  It appears that she would be an excellent candidate for left leg ablation of her great saphenous vein.  She will also have a formal venous duplex of her right leg on her return visit to determine if this would be appropriate as well.  She understands that [facility] are currently forming the vein procedures in our office and she will see 1 of these providers at her next visit   Rosetta Posner, MD FACS [facility] Tel [phone] Pager [phone]

## 2019-08-27 ENCOUNTER — Other Ambulatory Visit: Payer: Self-pay

## 2019-08-27 ENCOUNTER — Ambulatory Visit (INDEPENDENT_AMBULATORY_CARE_PROVIDER_SITE_OTHER): Payer: PRIVATE HEALTH INSURANCE | Admitting: Cardiology

## 2019-08-27 ENCOUNTER — Encounter: Payer: Self-pay | Admitting: Cardiology

## 2019-08-27 VITALS — BP 110/72 | HR 58 | Ht 62.0 in | Wt 248.0 lb

## 2019-08-27 DIAGNOSIS — R9389 Abnormal findings on diagnostic imaging of other specified body structures: Secondary | ICD-10-CM

## 2019-08-27 DIAGNOSIS — R0789 Other chest pain: Secondary | ICD-10-CM

## 2019-08-27 HISTORY — DX: Abnormal findings on diagnostic imaging of other specified body structures: R93.89

## 2019-08-27 NOTE — Progress Notes (Signed)
Cardiology Office Note:    Date:  08/27/2019   ID:  Judy Lucas, DOB 1956-07-03, MRN [id number]  PCP:  Nicholos Johns, MD  Cardiologist:  Jenean Lindau, MD   Referring MD: Nicholos Johns, MD    ASSESSMENT:    1. Chest discomfort   2. Morbid obesity ([place])    PLAN:    In order of problems listed above:  1. Chest discomfort: Noncardiac etiology and has resolved.  Coronary CT angiography report has been normal and discussed below.  Primary prevention stressed to the patient.  Importance of compliance with diet and medication stressed and she vocalized understanding.  Diet was discussed for weight loss and she is trying hard.  She has lost 12 pounds since the last few months. 2. Essential hypertension: Blood pressure is stable and diet was discussed.  Blood work following by primary care physician 3. Mixed dyslipidemia: Diet was discussed lipids followed by primary care. 4. She requests if she could get a pulmonary appointment for abnormal CT issues.  She is very anxious about it.  She is a Marine scientist by profession.  I will oblige to her request and set up an appointment and send a copy of this note to her primary care physician. 5. Patient will be seen in follow-up appointment in 6 months or earlier if the patient has any concerns    Medication Adjustments/Labs and Tests Ordered: Current medicines are reviewed at length with the patient today.  Concerns regarding medicines are outlined above.  No orders of the defined types were placed in this encounter.  No orders of the defined types were placed in this encounter.    Chief Complaint  Patient presents with  . Follow-up     History of Present Illness:    Judy Lucas is a 64 y.o. female.  Patient has past medical history of morbid obesity and dyslipidemia.  She was evaluated for chest pain.  Her coronary CT angiography did not reveal any evidence of coronary artery disease.  She is happy with it.  She had an abnormal area on the  lung CT and follow-up CT done in December was rather unremarkable but a 20-month follow-up was requested.  She mentions to me that her primary care physician has discussed this with her at length and she is aware of all the findings.  I pulled up the report from [facility] and again reviewed with her.  Past Medical History:  Diagnosis Date  . Allergy   . Chest pain 01/22/2019  . Dysphagia 01/22/2019  . Osteoarthritis 01/22/2019  . Renal insufficiency 01/22/2019    Past Surgical History:  Procedure Laterality Date  . ABDOMINAL HYSTERECTOMY    . APPENDECTOMY    . CHOLECYSTECTOMY    . feet surgery    . WRIST SURGERY      Current Medications: Current Meds  Medication Sig  . allopurinol (ZYLOPRIM) 300 MG tablet Take 150 mg by mouth See admin instructions. Takes 150mg  every other day (300mg  tablets, pt splints in half and takes half every other day)  . aspirin EC 81 MG tablet Take 81 mg by mouth daily.  Marland Kitchen b complex vitamins capsule Take 1 capsule by mouth daily.  . Cholecalciferol (VITAMIN D3) 50 MCG (2000 UT) capsule Take 2,000 Units by mouth daily.  Marland Kitchen DHEA 10 MG TABS Take 10 mg by mouth at bedtime.  . fluticasone (FLONASE) 50 MCG/ACT nasal spray USE TWO SPRAYS IN EACH NOSTRIL EVERY DAY  . loratadine (CLARITIN) 10 MG tablet  Take 1 tablet by mouth daily.  . meclizine (ANTIVERT) 12.5 MG tablet Take 12.5 mg by mouth 3 (three) times daily as needed.  . Melatonin 10 MG CAPS Take 10 mg by mouth at bedtime.  . mirabegron ER (MYRBETRIQ) 25 MG TB24 tablet Take 25 mg by mouth daily.  . montelukast (SINGULAIR) 10 MG tablet Take 10 mg by mouth at bedtime.  Marland Kitchen olmesartan (BENICAR) 20 MG tablet Take 20 mg by mouth daily.  Marland Kitchen omeprazole (PRILOSEC) 40 MG capsule Take 40 mg by mouth daily.  . simvastatin (ZOCOR) 10 MG tablet Take 10 mg by mouth daily.  Marland Kitchen spironolactone (ALDACTONE) 50 MG tablet Take 50 mg by mouth 2 (two) times daily.   . [DISCONTINUED] olmesartan (BENICAR) 40 MG tablet Take 20 mg by  mouth daily.      Allergies:   Meperidine, Morphine, Yellow dyes (non-tartrazine), Thyroid, Furosemide, Nitroglycerin, Penicillin g, and Sulfamethoxazole   Social History   Socioeconomic History  . Marital status: Married    Spouse name: Not on file  . Number of children: Not on file  . Years of education: Not on file  . Highest education level: Not on file  Occupational History  . Not on file  Tobacco Use  . Smoking status: Never Smoker  . Smokeless tobacco: Never Used  Substance and Sexual Activity  . Alcohol use: Never  . Drug use: Never  . Sexual activity: Not on file  Other Topics Concern  . Not on file  Social History Narrative  . Not on file   Social Determinants of Health   Financial Resource Strain:   . Difficulty of Paying Living Expenses: Not on file  Food Insecurity:   . Worried About Charity fundraiser in the Last Year: Not on file  . Ran Out of Food in the Last Year: Not on file  Transportation Needs:   . Lack of Transportation (Medical): Not on file  . Lack of Transportation (Non-Medical): Not on file  Physical Activity:   . Days of Exercise per Week: Not on file  . Minutes of Exercise per Session: Not on file  Stress:   . Feeling of Stress : Not on file  Social Connections:   . Frequency of Communication with Friends and Family: Not on file  . Frequency of Social Gatherings with Friends and Family: Not on file  . Attends Religious Services: Not on file  . Active Member of Clubs or Organizations: Not on file  . Attends Archivist Meetings: Not on file  . Marital Status: Not on file     Family History: The patient's family history includes Heart attack in her father and mother; Heart disease in her father and mother; Hyperlipidemia in her brother, father, mother, and sister; Hypertension in her father, mother, and sister; Skin cancer in her father.  ROS:   Please see the history of present illness.    All other systems reviewed and are  negative.  EKGs/Labs/Other Studies Reviewed:    The following studies were reviewed today: IMPRESSION: 1. Coronary calcium score of 0. This was 0 percentile for age and sex matched control.  2. Normal coronary origin with right dominance.  3. Study quality affected by patient's size, however tere is no evidence of CAD.   Electronically Signed   By: Ena Dawley   On: 02/04/2019 21:32   Recent Labs: 01/22/2019: BUN 20; Creatinine, Ser 0.82; Hemoglobin 11.1; Platelets 213; Potassium 4.3; Sodium 139  Recent Lipid Panel No  results found for: CHOL, TRIG, HDL, CHOLHDL, VLDL, LDLCALC, LDLDIRECT  Physical Exam:    VS:  BP 110/72 (BP Location: Right Arm, Patient Position: Sitting, Cuff Size: Large)   Pulse (!) 58   Ht 5\' 2"  (1.575 m)   Wt 248 lb (112.5 kg)   SpO2 98%   BMI 45.36 kg/m     Wt Readings from Last 3 Encounters:  08/27/19 248 lb (112.5 kg)  06/04/19 252 lb 11.2 oz (114.6 kg)  02/20/19 260 lb (117.9 kg)     GEN: Patient is in no acute distress HEENT: Normal NECK: No JVD; No carotid bruits LYMPHATICS: No lymphadenopathy CARDIAC: Hear sounds regular, 2/6 systolic murmur at the apex. RESPIRATORY:  Clear to auscultation without rales, wheezing or rhonchi  ABDOMEN: Soft, non-tender, non-distended MUSCULOSKELETAL:  No edema; No deformity  SKIN: Warm and dry NEUROLOGIC:  Alert and oriented x 3 PSYCHIATRIC:  Normal affect   Signed, Jenean Lindau, MD  08/27/2019 8:48 AM    [facility]

## 2019-08-27 NOTE — Patient Instructions (Signed)
Medication Instructions:  Your physician recommends that you continue on your current medications as directed. Please refer to the Current Medication list given to you today.  *If you need a refill on your cardiac medications before your next appointment, please call your pharmacy*  Lab Work: NONE If you have labs (blood work) drawn today and your tests are completely normal, you will receive your results only by: Marland Kitchen MyChart Message (if you have MyChart) OR . A paper copy in the mail If you have any lab test that is abnormal or we need to change your treatment, we will call you to review the results.  Testing/Procedures:  You are being referred to pulmonology. Someone will call you to schedule that appointment.  Follow-Up: At [facility], you and your health needs are our priority.  As part of our continuing mission to provide you with exceptional heart care, we have created designated Provider Care Teams.  These Care Teams include your primary Cardiologist (physician) and Advanced Practice Providers (APPs -  Physician Assistants and Nurse Practitioners) who all work together to provide you with the care you need, when you need it.  Your next appointment:   6 month(s)  The format for your next appointment:   In Person  Provider:   Jyl Heinz, MD

## 2019-09-04 ENCOUNTER — Telehealth: Payer: Self-pay | Admitting: Cardiology

## 2019-09-04 NOTE — Telephone Encounter (Signed)
I spoke with [place] pulmonary and [place] office is currently closed.  They scheduled patient to see Dr Carlis Abbott on January 28,2021 at 11:00 at [facility] office.  I called patient and gave her appointment information and office phone number.

## 2019-09-04 NOTE — Telephone Encounter (Signed)
  Patient is calling because at her last visit Dr Geraldo Pitter told her he was going to refer her to a pulmonologist but she has not heard anything further. She would like to know where this stands

## 2019-09-10 ENCOUNTER — Telehealth: Payer: Self-pay | Admitting: Cardiology

## 2019-09-10 NOTE — Telephone Encounter (Signed)
No vm setup. Judy Lucas is part of [place] and CT is in [place]. No need for records to be forwarded.

## 2019-09-10 NOTE — Telephone Encounter (Signed)
Patient calling requesting copies of her CT scan to be sent to Dr. Carlis Abbott at [facility].

## 2019-09-10 NOTE — Telephone Encounter (Signed)
Will forward this request to our [facility] Records Dept, to further assist pt with getting requested records to her Pulmonologist.

## 2019-09-12 ENCOUNTER — Encounter: Payer: Self-pay | Admitting: Critical Care Medicine

## 2019-09-12 ENCOUNTER — Other Ambulatory Visit: Payer: Self-pay

## 2019-09-12 ENCOUNTER — Ambulatory Visit: Payer: PRIVATE HEALTH INSURANCE | Admitting: Critical Care Medicine

## 2019-09-12 VITALS — BP 118/62 | HR 56 | Temp 97.3°F | Ht 62.0 in | Wt 249.2 lb

## 2019-09-12 DIAGNOSIS — R911 Solitary pulmonary nodule: Secondary | ICD-10-CM | POA: Diagnosis not present

## 2019-09-12 DIAGNOSIS — R9389 Abnormal findings on diagnostic imaging of other specified body structures: Secondary | ICD-10-CM

## 2019-09-12 NOTE — Progress Notes (Signed)
Synopsis: Referred in January 2021 for abnormal CT by Revankar, Reita Cliche, MD.  Subjective:   PATIENT ID: Judy Lucas GENDER: female DOB: 10-23-1955, MRN: [id number]  Chief Complaint  Patient presents with  . Consult    Growth in lungs, SOB    Judy Lucas is a 64 y/o woman referred for evaluation of an abnormal CT scan. She had a coronary calcium scoring CT scan to evaluate chest pain that has since been deemed noncardiac. She had an incidentally discovered LLL lung nodule, prompting a 106-month follow up CT scan which demonstrated another abnormality. She had another 31-month scan with persistence. She has no previous history of malignancy or tobacco abuse. She had a significant amount of second-hand smoke exposure as a child as both parents smoked. There is no family history of malignancy. She has a history of multiple pneumonias in the past, including in June and October 2020. She is concerned that she had covid in February 2020 before we were able to test for it, but she had loss of taste & smell, was flu negative, and was sick in bed for >1 week. She works a Marine scientist in [employer]. She has a history of seasonal allergies starting in February and multiple food allergies causing airway swelling, SOB, and cough.   H/o dysphagia, GERD, esophageal strictures requiring dilation. She was recently evaluated by GI and felt to have possible esophageal spasms. She is not currently on a PPI. She has been working on losing weight through lifestyle modifications.  Cardiologist Dr. Geraldo Pitter, office note reviewed 08/27/2019       Past Medical History:  Diagnosis Date  . Allergic rhinitis   . Allergy   . Chest pain 01/22/2019  . Dysphagia 01/22/2019  . Hyperlipidemia   . Hypertension   . Osteoarthritis 01/22/2019  . Renal insufficiency 01/22/2019     Family History  Problem Relation Age of Onset  . Hyperlipidemia Mother   . Hypertension Mother   . Heart disease Mother   . Heart attack Mother   .  Skin cancer Father   . Hyperlipidemia Father   . Hypertension Father   . Heart disease Father   . Heart attack Father   . Hypertension Sister   . Hyperlipidemia Sister   . Hyperlipidemia Brother      Past Surgical History:  Procedure Laterality Date  . ABDOMINAL HYSTERECTOMY    . APPENDECTOMY    . CHOLECYSTECTOMY    . feet surgery    . WRIST SURGERY      Social History   Socioeconomic History  . Marital status: Married    Spouse name: Not on file  . Number of children: Not on file  . Years of education: Not on file  . Highest education level: Not on file  Occupational History  . Not on file  Tobacco Use  . Smoking status: Never Smoker  . Smokeless tobacco: Never Used  Substance and Sexual Activity  . Alcohol use: Never  . Drug use: Never  . Sexual activity: Not on file  Other Topics Concern  . Not on file  Social History Narrative  . Not on file   Social Determinants of Health   Financial Resource Strain:   . Difficulty of Paying Living Expenses: Not on file  Food Insecurity:   . Worried About Charity fundraiser in the Last Year: Not on file  . Ran Out of Food in the Last Year: Not on file  Transportation Needs:   .  Lack of Transportation (Medical): Not on file  . Lack of Transportation (Non-Medical): Not on file  Physical Activity:   . Days of Exercise per Week: Not on file  . Minutes of Exercise per Session: Not on file  Stress:   . Feeling of Stress : Not on file  Social Connections:   . Frequency of Communication with Friends and Family: Not on file  . Frequency of Social Gatherings with Friends and Family: Not on file  . Attends Religious Services: Not on file  . Active Member of Clubs or Organizations: Not on file  . Attends Archivist Meetings: Not on file  . Marital Status: Not on file  Intimate Partner Violence:   . Fear of Current or Ex-Partner: Not on file  . Emotionally Abused: Not on file  . Physically Abused: Not on file  .  Sexually Abused: Not on file     Allergies  Allergen Reactions  . Meperidine Shortness Of Breath  . Morphine Shortness Of Breath  . Yellow Dyes (Non-Tartrazine) Anaphylaxis  . Thyroid Swelling  . Furosemide Rash    SWELLING ALLERGY SWELLING ALLERGY   . Nitroglycerin Palpitations  . Penicillin G Rash  . Sulfamethoxazole Rash     Immunization History  Administered Date(s) Administered  . Influenza,inj,Quad PF,6-35 Mos 05/16/2019  . Moderna SARS-COVID-2 Vaccination 09/09/2019    Outpatient Medications Prior to Visit  Medication Sig Dispense Refill  . allopurinol (ZYLOPRIM) 300 MG tablet Take 150 mg by mouth See admin instructions. Takes 150mg  every other day (300mg  tablets, pt splints in half and takes half every other day)    . aspirin EC 81 MG tablet Take 81 mg by mouth daily.    Marland Kitchen b complex vitamins capsule Take 1 capsule by mouth daily.    . Cholecalciferol (VITAMIN D3) 50 MCG (2000 UT) capsule Take 2,000 Units by mouth daily.    Marland Kitchen DHEA 10 MG TABS Take 10 mg by mouth at bedtime.    . fluticasone (FLONASE) 50 MCG/ACT nasal spray USE TWO SPRAYS IN EACH NOSTRIL EVERY DAY    . loratadine (CLARITIN) 10 MG tablet Take 1 tablet by mouth daily.    . meclizine (ANTIVERT) 12.5 MG tablet Take 12.5 mg by mouth 3 (three) times daily as needed.    . Melatonin 10 MG CAPS Take 10 mg by mouth at bedtime.    . mirabegron ER (MYRBETRIQ) 25 MG TB24 tablet Take 25 mg by mouth daily.    . montelukast (SINGULAIR) 10 MG tablet Take 10 mg by mouth at bedtime.    Marland Kitchen olmesartan (BENICAR) 20 MG tablet Take 20 mg by mouth daily.    Marland Kitchen omeprazole (PRILOSEC) 40 MG capsule Take 40 mg by mouth daily.    . simvastatin (ZOCOR) 10 MG tablet Take 10 mg by mouth daily.    Marland Kitchen spironolactone (ALDACTONE) 50 MG tablet Take 50 mg by mouth 2 (two) times daily.      No facility-administered medications prior to visit.    Review of Systems  Constitutional: Negative for chills and fever.       Planned weight loss   HENT: Negative for congestion.   Respiratory: Positive for shortness of breath. Negative for wheezing.   Cardiovascular: Positive for chest pain. Negative for palpitations and leg swelling.  Gastrointestinal: Negative for heartburn, nausea and vomiting.  Endo/Heme/Allergies: Positive for environmental allergies.     Objective:   Vitals:   09/12/19 1104  BP: 118/62  Pulse: (!) 56  Temp: Marland Kitchen)  97.3 F (36.3 C)  TempSrc: Temporal  SpO2: 97%  Weight: 249 lb 3.2 oz (113 kg)  Height: 5\' 2"  (1.575 m)   97% on  RA BMI Readings from Last 3 Encounters:  09/12/19 45.58 kg/m  08/27/19 45.36 kg/m  06/04/19 46.22 kg/m   Wt Readings from Last 3 Encounters:  09/12/19 249 lb 3.2 oz (113 kg)  08/27/19 248 lb (112.5 kg)  06/04/19 252 lb 11.2 oz (114.6 kg)    Physical Exam Vitals reviewed.  Constitutional:      General: She is not in acute distress.    Appearance: She is obese. She is not ill-appearing.  HENT:     Head: Normocephalic and atraumatic.     Nose:     Comments: Deferred due to masking requirement.    Mouth/Throat:     Comments: Deferred due to masking requirement. Eyes:     General: No scleral icterus. Cardiovascular:     Rate and Rhythm: Normal rate and regular rhythm.     Heart sounds: No murmur.  Pulmonary:     Comments: Breathing comfortably on RA, CTAB Abdominal:     General: There is no distension.     Palpations: Abdomen is soft.     Tenderness: There is no abdominal tenderness.  Musculoskeletal:        General: No swelling or deformity.     Cervical back: Neck supple.  Lymphadenopathy:     Cervical: No cervical adenopathy.  Skin:    General: Skin is warm and dry.     Findings: No rash.  Neurological:     General: No focal deficit present.     Mental Status: She is alert.     Motor: No weakness.     Coordination: Coordination normal.  Psychiatric:        Mood and Affect: Mood normal.        Behavior: Behavior normal.      CBC    Component  Value Date/Time   WBC 6.3 01/22/2019 1643   RBC 3.37 (L) 01/22/2019 1643   HGB 11.1 01/22/2019 1643   HCT 32.5 (L) 01/22/2019 1643   PLT 213 01/22/2019 1643   MCV 96 01/22/2019 1643   MCH 32.9 01/22/2019 1643   MCHC 34.2 01/22/2019 1643   RDW 13.7 01/22/2019 1643    CHEMISTRY No results for input(s): NA, K, CL, CO2, GLUCOSE, BUN, CREATININE, CALCIUM, MG, PHOS in the last 168 hours. CrCl cannot be calculated (Patient's most recent lab result is older than the maximum 21 days allowed.).   Chest Imaging- films reviewed: CTA coronary 02/04/2019- dependent subpleural left lateral opacity, likely atelectasis.  Mild GGO throughout the lungs. No significant CAD-calcium score 0.  CT chests from [facility] reviewed, including 07/2019 CT- resolved LLL subpleural opacity. Persistence of irregular GGN with part-solid component, possibly an ~1cm intraparenchymal LN. No growth over 37-month time frame.  Pulmonary Functions Testing Results: No flowsheet data found.       Assessment & Plan:     ICD-10-CM   1. Abnormal CT of the chest  R93.89 CT Chest Wo Contrast  2. Solitary pulmonary nodule  R91.1 CT Chest Wo Contrast    Abnormal CT chest- RLL pulmonary irregular nodular opacity, new in Sept, persistent & unchanged in December. She had a pulmonary infection during the summer and fall per her report, raising concern that this could be post-infectious scarring or residual inflammation. Low risk for malignancy given location, lack of smoking history, lack of personal or  family history of malignancy. -Reviewed CT scan with Judy Lucas and explained that incidental findings on screening tests are common but luckily often do not lead to a diagnosis of cancer in someone with low risk such as herself.  -6-12 month follow up CT chest; she opted for 64-month follow up. Would complete sooner if she developed new/ concerning pulmonary or systemic symptoms  -recommend treatment of underlying GERD  RTC in  December 2021 after repeat CT chest.   Current Outpatient Medications:  .  allopurinol (ZYLOPRIM) 300 MG tablet, Take 150 mg by mouth See admin instructions. Takes 150mg  every other day (300mg  tablets, pt splints in half and takes half every other day), Disp: , Rfl:  .  aspirin EC 81 MG tablet, Take 81 mg by mouth daily., Disp: , Rfl:  .  b complex vitamins capsule, Take 1 capsule by mouth daily., Disp: , Rfl:  .  Cholecalciferol (VITAMIN D3) 50 MCG (2000 UT) capsule, Take 2,000 Units by mouth daily., Disp: , Rfl:  .  DHEA 10 MG TABS, Take 10 mg by mouth at bedtime., Disp: , Rfl:  .  fluticasone (FLONASE) 50 MCG/ACT nasal spray, USE TWO SPRAYS IN EACH NOSTRIL EVERY DAY, Disp: , Rfl:  .  loratadine (CLARITIN) 10 MG tablet, Take 1 tablet by mouth daily., Disp: , Rfl:  .  meclizine (ANTIVERT) 12.5 MG tablet, Take 12.5 mg by mouth 3 (three) times daily as needed., Disp: , Rfl:  .  Melatonin 10 MG CAPS, Take 10 mg by mouth at bedtime., Disp: , Rfl:  .  mirabegron ER (MYRBETRIQ) 25 MG TB24 tablet, Take 25 mg by mouth daily., Disp: , Rfl:  .  montelukast (SINGULAIR) 10 MG tablet, Take 10 mg by mouth at bedtime., Disp: , Rfl:  .  olmesartan (BENICAR) 20 MG tablet, Take 20 mg by mouth daily., Disp: , Rfl:  .  omeprazole (PRILOSEC) 40 MG capsule, Take 40 mg by mouth daily., Disp: , Rfl:  .  simvastatin (ZOCOR) 10 MG tablet, Take 10 mg by mouth daily., Disp: , Rfl:  .  spironolactone (ALDACTONE) 50 MG tablet, Take 50 mg by mouth 2 (two) times daily. , Disp: , Rfl:      Julian Hy, DO [facility] 09/12/2019 9:47 PM

## 2019-09-12 NOTE — Progress Notes (Signed)
   Subjective:    Patient ID: Judy Lucas, female    DOB: 08-01-1956, 64 y.o.   MRN: [id number]  HPI    Review of Systems  Constitutional: Negative for fever and unexpected weight change.  HENT: Positive for dental problem, ear pain and trouble swallowing. Negative for congestion, nosebleeds, postnasal drip, rhinorrhea, sinus pressure, sneezing and sore throat.   Eyes: Positive for itching. Negative for redness.  Respiratory: Positive for shortness of breath. Negative for cough, chest tightness and wheezing.   Cardiovascular: Positive for leg swelling. Negative for palpitations.  Gastrointestinal: Negative for nausea and vomiting.  Genitourinary: Positive for dysuria.  Musculoskeletal: Positive for joint swelling.  Skin: Negative for rash.  Allergic/Immunologic: Positive for environmental allergies and food allergies. Negative for immunocompromised state.  Neurological: Negative for headaches.  Hematological: Does not bruise/bleed easily.  Psychiatric/Behavioral: Negative for dysphoric mood. The patient is not nervous/anxious.        Objective:   Physical Exam        Assessment & Plan:

## 2019-09-12 NOTE — Patient Instructions (Addendum)
Thank you for visiting Dr. Carlis Abbott at [facility]. We recommend the following: Orders Placed This Encounter  Procedures  . CT Chest Wo Contrast   Orders Placed This Encounter  Procedures  . CT Chest Wo Contrast    December 2021    Standing Status:   Future    Standing Expiration Date:   11/09/2020    Order Specific Question:   ** REASON FOR EXAM (FREE TEXT)    Answer:   follow up RLL nodule    Order Specific Question:   Preferred imaging location?    Answer:   [facility]    Order Specific Question:   Radiology Contrast Protocol - do NOT remove file path    Answer:   [email or web address]      Return in about 1 year (around 09/11/2020).  December 2021 after CT    Please do your part to reduce the spread of COVID-19.

## 2019-09-19 ENCOUNTER — Encounter (HOSPITAL_COMMUNITY): Payer: PRIVATE HEALTH INSURANCE

## 2019-09-19 ENCOUNTER — Ambulatory Visit: Payer: PRIVATE HEALTH INSURANCE | Admitting: Vascular Surgery

## 2019-09-25 ENCOUNTER — Telehealth (HOSPITAL_COMMUNITY): Payer: Self-pay

## 2019-09-25 NOTE — Telephone Encounter (Signed)

## 2019-09-26 ENCOUNTER — Ambulatory Visit: Payer: PRIVATE HEALTH INSURANCE | Admitting: Vascular Surgery

## 2019-09-26 ENCOUNTER — Other Ambulatory Visit: Payer: Self-pay

## 2019-09-26 ENCOUNTER — Ambulatory Visit (HOSPITAL_COMMUNITY)
Admission: RE | Admit: 2019-09-26 | Discharge: 2019-09-26 | Disposition: A | Discharge status: Home / Self Care | Payer: PRIVATE HEALTH INSURANCE | Source: Ambulatory Visit | Attending: Vascular Surgery | Admitting: Vascular Surgery

## 2019-09-26 ENCOUNTER — Encounter: Payer: Self-pay | Admitting: Vascular Surgery

## 2019-09-26 VITALS — BP 112/57 | HR 55 | Resp 20 | Ht 63.0 in | Wt 248.1 lb

## 2019-09-26 DIAGNOSIS — I872 Venous insufficiency (chronic) (peripheral): Secondary | ICD-10-CM | POA: Diagnosis not present

## 2019-09-26 DIAGNOSIS — I83893 Varicose veins of bilateral lower extremities with other complications: Secondary | ICD-10-CM | POA: Insufficient documentation

## 2019-09-26 NOTE — Progress Notes (Signed)
Patient name: Judy Lucas MRN: [id number] DOB: [birth year] Sex: female  REASON FOR VISIT:   66-month follow-up visit.  HPI:   Judy Lucas is a pleasant 64 y.o. female who was seen by Dr. Sherren Mocha Early on 06/04/2019 with mild lower lower extremity venous insufficiency.  She had bilateral venous stasis disease which was more significant on the left side.  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  She has been wearing panty style compression stockings and also knee-high stockings while at work.  She had progressive skin changes at her ankles.  She had hyperpigmentation bilaterally and also corona phlebectasia at her ankles.  Her previous duplex scan of the left great saphenous vein showed that although the vein was deep, she had significant reflux in the left great saphenous vein which was significantly dilated.  She was also noted to have a dilated right great saphenous vein.  She was encouraged to elevate her legs, lose weight, continue to wear her compression stockings, and take ibuprofen as needed for pain.  She comes in for 30-month follow-up visit.  He was felt of her symptoms were not better she would be a potential candidate for laser ablation of the left great saphenous vein.  On my history, the patient describes aching pain, throbbing, and a heavy sensation in both legs which is aggravated by sitting and standing and relieved somewhat with elevation.  She has been wearing her 20-30 compression stockings which also help her symptoms.  However she is having significant discomfort as she is sitting a lot working from home currently.  She said no previous venous procedures.  She has had no previous history of DVT.  Her symptoms are more significant on the left side.  I do not get any history of claudication or rest pain.  Past Medical History:  Diagnosis Date  . Allergic rhinitis   . Allergy   . Chest pain 01/22/2019  . Dysphagia 01/22/2019  . Hyperlipidemia   .  Hypertension   . Osteoarthritis 01/22/2019  . Renal insufficiency 01/22/2019    Family History  Problem Relation Age of Onset  . Hyperlipidemia Mother   . Hypertension Mother   . Heart disease Mother   . Heart attack Mother   . Skin cancer Father   . Hyperlipidemia Father   . Hypertension Father   . Heart disease Father   . Heart attack Father   . Hypertension Sister   . Hyperlipidemia Sister   . Hyperlipidemia Brother     SOCIAL HISTORY: Social History   Tobacco Use  . Smoking status: Never Smoker  . Smokeless tobacco: Never Used  Substance Use Topics  . Alcohol use: Never    Allergies  Allergen Reactions  . Meperidine Shortness Of Breath  . Morphine Shortness Of Breath  . Yellow Dyes (Non-Tartrazine) Anaphylaxis  . Thyroid Swelling  . Furosemide Rash    SWELLING ALLERGY SWELLING ALLERGY   . Nitroglycerin Palpitations  . Penicillin G Rash  . Sulfamethoxazole Rash    Current Outpatient Medications  Medication Sig Dispense Refill  . allopurinol (ZYLOPRIM) 300 MG tablet Take 150 mg by mouth See admin instructions. Takes 150mg  every other day (300mg  tablets, pt splints in half and takes half every other day)    . aspirin EC 81 MG tablet Take 81 mg by mouth daily.    Marland Kitchen b complex vitamins capsule Take 1 capsule by mouth daily.    . Cholecalciferol (VITAMIN D3) 50  MCG (2000 UT) capsule Take 2,000 Units by mouth daily.    Marland Kitchen DHEA 10 MG TABS Take 10 mg by mouth at bedtime.    . fluticasone (FLONASE) 50 MCG/ACT nasal spray USE TWO SPRAYS IN EACH NOSTRIL EVERY DAY    . loratadine (CLARITIN) 10 MG tablet Take 1 tablet by mouth daily.    . meclizine (ANTIVERT) 12.5 MG tablet Take 12.5 mg by mouth 3 (three) times daily as needed.    . Melatonin 10 MG CAPS Take 10 mg by mouth at bedtime.    . mirabegron ER (MYRBETRIQ) 25 MG TB24 tablet Take 25 mg by mouth daily.    . montelukast (SINGULAIR) 10 MG tablet Take 10 mg by mouth at bedtime.    Marland Kitchen olmesartan (BENICAR) 20 MG tablet  Take 20 mg by mouth daily.    Marland Kitchen omeprazole (PRILOSEC) 40 MG capsule Take 40 mg by mouth daily.    . simvastatin (ZOCOR) 10 MG tablet Take 10 mg by mouth daily.    Marland Kitchen spironolactone (ALDACTONE) 50 MG tablet Take 50 mg by mouth 2 (two) times daily.      No current facility-administered medications for this visit.    REVIEW OF SYSTEMS:  [X]  denotes positive finding, [ ]  denotes negative finding Cardiac  Comments:  Chest pain or chest pressure:    Shortness of breath upon exertion:    Short of breath when lying flat:    Irregular heart rhythm:        Vascular    Pain in calf, thigh, or hip brought on by ambulation:    Pain in feet at night that wakes you up from your sleep:     Blood clot in your veins:    Leg swelling:  x       Pulmonary    Oxygen at home:    Productive cough:     Wheezing:         Neurologic    Sudden weakness in arms or legs:     Sudden numbness in arms or legs:     Sudden onset of difficulty speaking or slurred speech:    Temporary loss of vision in one eye:     Problems with dizziness:         Gastrointestinal    Blood in stool:     Vomited blood:         Genitourinary    Burning when urinating:     Blood in urine:        Psychiatric    Major depression:         Hematologic    Bleeding problems:    Problems with blood clotting too easily:        Skin    Rashes or ulcers:        Constitutional    Fever or chills:     PHYSICAL EXAM:   Vitals:   09/26/19 1344  BP: (!) 112/57  Pulse: (!) 55  Resp: 20  SpO2: 99%  Weight: 248 lb 1.6 oz (112.5 kg)  Height: 5\' 3"  (1.6 m)   Body mass index is 43.95 kg/m.  GENERAL: The patient is a well-nourished female, in no acute distress. The vital signs are documented above. CARDIAC: There is a regular rate and rhythm.  VASCULAR: I do not detect carotid bruits. She has palpable pedal pulses bilaterally. VENOUS EXAM: She does have hyperpigmentation and lipodermatosclerosis of the left leg as  documented below.  She has spider veins and  telangiectasias bilaterally. I did look at her left great saphenous vein myself with the SonoSite.  She has reflux from the saphenofemoral junction to the distal thigh.  Of note at the junction of the middle and distal third of the thigh the vein exits the fascia.  The vein is dilated.  Of note it is about 5 cm deep which certainly would make laser ablation more technically challenging. PULMONARY: There is good air exchange bilaterally without wheezing or rales. ABDOMEN: Soft and non-tender with normal pitched bowel sounds.  MUSCULOSKELETAL: There are no major deformities or cyanosis. NEUROLOGIC: No focal weakness or paresthesias are detected. SKIN: There are no ulcers or rashes noted. PSYCHIATRIC: The patient has a normal affect.  DATA:    VENOUS DUPLEX: I have reviewed the venous duplex scan that was done back in August of the left lower extremity.  The patient had no evidence of DVT.  There was superficial venous thrombus noted in the left small saphenous vein which was chronic.  There was deep venous reflux involving the common femoral vein and popliteal vein.  There was reflux at the saphenofemoral junction and in the great saphenous vein in the thigh.  The vein was dilated.  ARTERIAL DOPPLER STUDY: The patient also had an arterial Doppler study back in August 2020.  This showed triphasic Doppler signals in both feet with an ABI of 100% bilaterally.  VENOUS DUPLEX: I have independently interpreted her venous duplex scan today.    On the left side there is no evidence of DVT.  There is superficial venous thrombosis involving the left small saphenous vein which appears to be chronic.  There is significant deep venous reflux involving the common femoral, femoral vein, and popliteal veins.  There is superficial venous reflux in the left great saphenous vein to the distal thigh where the vein then becomes superficial above the fascia.  The vein is  dilated with diameters up to 0.7 cm.  MEDICAL ISSUES:   CHRONIC VENOUS INSUFFICIENCY: This patient has deep and superficial venous reflux on the left.  She has been elevating her legs, wearing her 20-30 compression stockings and trying to exercise as much as possible.  She continues to have significant symptoms and for this reason I think she would be a candidate for laser ablation of the left great saphenous vein in the mid and proximal thigh.  Given her size (BMI equals 44), certainly this would be more technically challenging.  However I think we could likely cannulated in the mid thigh.  I have discussed the indications for endovenous laser ablation of the left GSV, that is to lower the pressure in the veins and potentially help relieve the symptoms from venous hypertension. I have also discussed alternative options including conservative treatment with leg elevation, compression therapy, exercise, avoiding prolonged sitting and standing, and weight management. I have discussed the potential complications of the procedure, including, but not limited to: bleeding, bruising, leg swelling, nerve injury, skin burns, significant pain from phlebitis, deep venous thrombosis, or failure of the vein to close.  I have also explained that venous insufficiency is a chronic disease, and that the patient is at risk for recurrent varicose veins in the future.  All of the patient's questions were encouraged and answered. They are agreeable to proceed.   Deitra Mayo [facility] [phone]

## 2019-10-07 ENCOUNTER — Other Ambulatory Visit: Payer: Self-pay | Admitting: *Deleted

## 2019-10-07 DIAGNOSIS — I83813 Varicose veins of bilateral lower extremities with pain: Secondary | ICD-10-CM

## 2019-10-09 ENCOUNTER — Encounter: Payer: Self-pay | Admitting: Vascular Surgery

## 2019-10-17 ENCOUNTER — Other Ambulatory Visit: Payer: Self-pay

## 2019-10-17 ENCOUNTER — Encounter: Payer: Self-pay | Admitting: Vascular Surgery

## 2019-10-17 ENCOUNTER — Ambulatory Visit: Payer: PRIVATE HEALTH INSURANCE | Admitting: Vascular Surgery

## 2019-10-17 VITALS — BP 100/66 | HR 56 | Temp 97.0°F | Resp 16 | Ht 62.0 in | Wt 249.0 lb

## 2019-10-17 DIAGNOSIS — I872 Venous insufficiency (chronic) (peripheral): Secondary | ICD-10-CM

## 2019-10-17 HISTORY — PX: ENDOVENOUS ABLATION SAPHENOUS VEIN W/ LASER: SUR449

## 2019-10-17 NOTE — Progress Notes (Signed)
     Laser Ablation Procedure    Date: 10/17/2019   Judy Lucas DOB:16-Jan-1956  Consent signed: Yes    Surgeon: Gae Gallop MD    Procedure: Laser Ablation: left Greater Saphenous Vein anterior accessory branch   BP 100/66 (BP Location: Left Arm, Patient Position: Sitting, Cuff Size: Large)   Pulse (!) 56   Temp (!) 97 F (36.1 C) (Temporal)   Resp 16   Ht 5\' 2"  (1.575 m)   Wt 249 lb (112.9 kg)   SpO2 99%   BMI 45.54 kg/m   Tumescent Anesthesia: 200 cc 0.9% NaCl with 50 cc Lidocaine HCL 1%  and 15 cc 8.4% NaHCO3  Local Anesthesia: 4 cc Lidocaine HCL and NaHCO3 (ratio 2:1)  15 watts continuous mode        Total energy: 179 Joules   Total time: 0:11   Laser Fiber Ref. # [id number]      Lot # [id number]     Patient tolerated procedure well  Notes: Patient wore face mask.  All staff members wore facial masks and facial shields/goggles.    Description of Procedure:  After marking the course of the secondary varicosities, the patient was placed on the operating table in the supine position, and the left leg was prepped and draped in sterile fashion.   Local anesthetic was administered and under ultrasound guidance the saphenous vein was accessed with a micro needle and guide wire; then the mirco puncture sheath was placed.  A guide wire was inserted saphenofemoral junction , followed by a 5 french sheath.  The position of the sheath and then the laser fiber below the junction was confirmed using the ultrasound.  Tumescent anesthesia was administered along the course of the saphenous vein using ultrasound guidance. The patient was placed in Trendelenburg position and protective laser glasses were placed on patient and staff, and the laser was fired at 15 watts continuous mode advancing 1-32mm/second for a total of 179 joules.       Steri strips were applied to the IV insertion sites and ABD pads and thigh high compression stockings were applied.  Ace wrap bandages were applied  over the left thigh and at the top of the saphenofemoral junction. Blood loss was less than 15 cc.  Discharge instructions reviewed with patient and hardcopy of discharge instructions given to patient to take home. The patient ambulated out of the operating room having tolerated the procedure well.

## 2019-10-17 NOTE — Progress Notes (Signed)
Patient name: Judy Lucas MRN: [id number] DOB: September 15, 1955 Sex: female  REASON FOR VISIT: For endovenous laser ablation of the left great saphenous vein.  HPI: Judy Lucas is a 63 y.o. female who I saw on 09/26/2019.  The patient had bilateral venous stasis disease that was more significant on the left side.  She had previously been seen by Dr. Curt Jews.  Despite aggressive conservative treatment including thigh-high compression stockings, leg elevation, and ibuprofen as needed for pain she was continuing to have significant symptoms.  On exam she had spider veins and telangiectasias bilaterally.  Her venous duplex scan showed chronic superficial venous thrombus noted in the left small saphenous vein.  There was deep venous reflux involving the common femoral vein and popliteal vein.  There was reflux in the saphenofemoral vein from the saphenofemoral junction to the distal thigh.  I noted myself with the SonoSite that in the middle to distal third of the thigh the vein exited the fascia.  In addition I noted that the vein was 5 cm deep that would certainly make laser ablation more technically challenging.  I felt that we would likely try to cannulate the vein in the mid thigh.  She comes in for laser ablation of the left great saphenous vein in the proximal thigh.  Current Outpatient Medications  Medication Sig Dispense Refill  . allopurinol (ZYLOPRIM) 300 MG tablet Take 150 mg by mouth See admin instructions. Takes 150mg  every other day (300mg  tablets, pt splints in half and takes half every other day)    . aspirin EC 81 MG tablet Take 81 mg by mouth daily.    Marland Kitchen b complex vitamins capsule Take 1 capsule by mouth daily.    . Cholecalciferol (VITAMIN D3) 50 MCG (2000 UT) capsule Take 2,000 Units by mouth daily.    Marland Kitchen DHEA 10 MG TABS Take 10 mg by mouth at bedtime.    . fluticasone (FLONASE) 50 MCG/ACT nasal spray USE TWO SPRAYS IN EACH NOSTRIL EVERY DAY    . loratadine (CLARITIN) 10 MG tablet  Take 1 tablet by mouth daily.    . meclizine (ANTIVERT) 12.5 MG tablet Take 12.5 mg by mouth 3 (three) times daily as needed.    . Melatonin 10 MG CAPS Take 10 mg by mouth at bedtime.    . mirabegron ER (MYRBETRIQ) 25 MG TB24 tablet Take 25 mg by mouth daily.    . montelukast (SINGULAIR) 10 MG tablet Take 10 mg by mouth at bedtime.    Marland Kitchen olmesartan (BENICAR) 20 MG tablet Take 20 mg by mouth daily.    Marland Kitchen omeprazole (PRILOSEC) 40 MG capsule Take 40 mg by mouth daily.    . simvastatin (ZOCOR) 10 MG tablet Take 10 mg by mouth daily.    Marland Kitchen spironolactone (ALDACTONE) 50 MG tablet Take 50 mg by mouth 2 (two) times daily.      No current facility-administered medications for this visit.    PHYSICAL EXAM: Vitals:   10/17/19 0826  BP: 100/66  Pulse: (!) 56  Resp: 16  Temp: (!) 97 F (36.1 C)  TempSrc: Temporal  SpO2: 99%  Weight: 249 lb (112.9 kg)  Height: 5\' 2"  (1.575 m)    PROCEDURE: Laser ablation left great saphenous vein  TECHNIQUE: The patient was taken to the exam room and placed supine.  I looked at the left great saphenous vein myself with the SonoSite and felt that I could cannulate the vein in the mid thigh.  Of note it also  looks like there was an anterior accessory saphenous vein.  The left leg was prepped and draped in the usual sterile fashion.  Under ultrasound guidance, after the skin was anesthetized I was able to cannulate the great saphenous vein in the mid thigh.  It was very deep and needle barely reached.  However I was able to advance the wire and ultimately get the micropuncture sheath into the vein.  The J-wire would not advance through the vein so I used the straight end of the wire and was able to advance this up to near the saphenofemoral junction.  The sheath was then advanced over the wire.  Next attempted to pass the laser fiber through the sheath but there was some resistance.  I was concerned that this was not going easy so I retracted the sheath and there appeared  to be a hole in the sheath it looks like the laser fiber had potentially exited the sheath.  For this reason he got a new sheath.  I was able to get in the vein higher up after the skin was anesthetized but again had issues with the sheath given the depth.  For this reason I elected to address the anterior accessory vein.  After the skin was anesthetized with 1% lidocaine, under ultrasound guidance I cannulated the anterior sensory vein and the micropuncture wire was advanced easily.  The micropuncture sheath was advanced over the wire.  I then advanced the J-wire to well above the saphenofemoral junction.  Given her obesity visualization was challenging therefore I stayed well away from the saphenofemoral junction.  The laser fiber was positioned at the end of the sheath and then the sheath retracted.  Tumescent anesthesia was admitted circumferentially around the vein.  Laser ablation was then performed of the anterior sensory saphenous vein in the proximal thigh.  180 J of energy were used.  Patient tolerated procedure well.  She will return in 1 week for follow-up duplex.      Deitra Mayo [facility] [place] [phone]

## 2019-10-23 ENCOUNTER — Telehealth (HOSPITAL_COMMUNITY): Payer: Self-pay

## 2019-10-23 NOTE — Telephone Encounter (Signed)

## 2019-10-24 ENCOUNTER — Ambulatory Visit (INDEPENDENT_AMBULATORY_CARE_PROVIDER_SITE_OTHER): Payer: PRIVATE HEALTH INSURANCE | Admitting: Vascular Surgery

## 2019-10-24 ENCOUNTER — Encounter: Payer: Self-pay | Admitting: Vascular Surgery

## 2019-10-24 ENCOUNTER — Other Ambulatory Visit: Payer: Self-pay

## 2019-10-24 ENCOUNTER — Ambulatory Visit (HOSPITAL_COMMUNITY)
Admission: RE | Admit: 2019-10-24 | Discharge: 2019-10-24 | Disposition: A | Discharge status: Home / Self Care | Payer: PRIVATE HEALTH INSURANCE | Source: Ambulatory Visit | Attending: Vascular Surgery | Admitting: Vascular Surgery

## 2019-10-24 VITALS — BP 103/66 | HR 53 | Temp 97.0°F | Resp 18 | Ht 62.0 in | Wt 249.5 lb

## 2019-10-24 DIAGNOSIS — I872 Venous insufficiency (chronic) (peripheral): Secondary | ICD-10-CM | POA: Diagnosis not present

## 2019-10-24 DIAGNOSIS — I83813 Varicose veins of bilateral lower extremities with pain: Secondary | ICD-10-CM

## 2019-10-24 MED ORDER — RIVAROXABAN (XARELTO) VTE STARTER PACK (15 & 20 MG): ORAL_TABLET | ORAL | 0 refills | Status: DC

## 2019-10-24 NOTE — Progress Notes (Signed)
Patient name: Judy Lucas MRN: [id number] DOB: 1955-12-02 Sex: female  REASON FOR VISIT: Follow-up after endovenous laser ablation of the left great saphenous vein.  HPI: Judy Lucas is a 64 y.o. female who had bilateral venous stasis disease with CEAP C4 venous disease.  Despite aggressive conservative treatment she was having significant symptoms and was felt to be a reasonable candidate for laser ablation of the left great saphenous vein.  She also had deep venous reflux.  The procedure was technically challenging as the vein was quite deep.  A difficult time passing the sheath up the great saphenous vein because of the depth and I think the sheath was kinking.  Ultimately I performed laser ablation of the anterior accessory vein.  She comes in for a 1 week follow-up visit.  SYMPTOMS: She has no complaints and overall is doing well.  She denies leg swelling or bruising.  CONSERVATIVE TREATMENT: She continues with leg elevation and has been wearing her compression stocking.  VENOUS INTERVENTIONS: She has no venous interventions planned in the right leg.  Current Outpatient Medications  Medication Sig Dispense Refill  . allopurinol (ZYLOPRIM) 300 MG tablet Take 150 mg by mouth See admin instructions. Takes 150mg  every other day (300mg  tablets, pt splints in half and takes half every other day)    . aspirin EC 81 MG tablet Take 81 mg by mouth daily.    Marland Kitchen b complex vitamins capsule Take 1 capsule by mouth daily.    . Cholecalciferol (VITAMIN D3) 50 MCG (2000 UT) capsule Take 2,000 Units by mouth daily.    Marland Kitchen DHEA 10 MG TABS Take 10 mg by mouth at bedtime.    . fluticasone (FLONASE) 50 MCG/ACT nasal spray USE TWO SPRAYS IN EACH NOSTRIL EVERY DAY    . loratadine (CLARITIN) 10 MG tablet Take 1 tablet by mouth daily.    . meclizine (ANTIVERT) 12.5 MG tablet Take 12.5 mg by mouth 3 (three) times daily as needed.    . Melatonin 10 MG CAPS Take 10 mg by mouth at bedtime.    . mirabegron ER  (MYRBETRIQ) 25 MG TB24 tablet Take 25 mg by mouth daily.    . montelukast (SINGULAIR) 10 MG tablet Take 10 mg by mouth at bedtime.    Marland Kitchen olmesartan (BENICAR) 20 MG tablet Take 20 mg by mouth daily.    Marland Kitchen omeprazole (PRILOSEC) 40 MG capsule Take 40 mg by mouth daily.    . simvastatin (ZOCOR) 10 MG tablet Take 10 mg by mouth daily.    Marland Kitchen spironolactone (ALDACTONE) 50 MG tablet Take 50 mg by mouth 2 (two) times daily.     . Rivaroxaban 15 & 20 MG TBPK Follow package directions: Take one 15mg  tablet by mouth twice a day. On day 22, switch to one 20mg  tablet once a day. Take with food. 51 each 0   No current facility-administered medications for this visit.   REVIEW OF SYSTEMS: Valu.Nieves ] denotes positive finding; [  ] denotes negative finding  CARDIOVASCULAR:  [ ]  chest pain   [ ]  dyspnea on exertion  [ ]  leg swelling  CONSTITUTIONAL:  [ ]  fever   [ ]  chills  PHYSICAL EXAM: Vitals:   10/24/19 1044  BP: 103/66  Pulse: (!) 53  Resp: 18  Temp: (!) 97 F (36.1 C)  TempSrc: Temporal  SpO2: 99%  Weight: 249 lb 8 oz (113.2 kg)  Height: 5\' 2"  (1.575 m)   GENERAL: The patient is a well-nourished female, in  no acute distress. The vital signs are documented above. CARDIOVASCULAR: There is a regular rate and rhythm. PULMONARY: There is good air exchange bilaterally without wheezing or rales. VASCULAR: She has no significant leg swelling on the left.  She has no significant bruising.  DATA:  VENOUS DUPLEX: The left great saphenous vein is noncompressible and the anterior accessory vein is noncompressible.  She does have some extension of the clot in the saphenous vein into the common femoral vein consistent with an EHIT -3.  MEDICAL ISSUES:  S/P EVLA OF LEFT GREAT SAPHENOUS VEIN AND LEFT ANTERIOR ACCESSORY SAPHENOUS VEIN: The patient is doing well.  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  I have ordered her a Xarelto starter pack and will continue this for 6 weeks.  When the starter pack ends after 4 weeks we will  have to call in a prescription for 2 more weeks of Xarelto (20 mg daily).  I encouraged her to continue to elevate her leg and wear compression stockings when she will be on her feet.  I will see her back in 6 weeks with a follow-up duplex.  Deitra Mayo [facility] [place] [phone]

## 2019-10-28 ENCOUNTER — Telehealth: Payer: Self-pay | Admitting: *Deleted

## 2019-10-28 NOTE — Telephone Encounter (Signed)
Returning Judy Lucas telephone message regarding returning to work next week.  Judy Lucas is s/p endovenous laser ablation of anterior accessory branch of left greater saphenous vein by Dr. Scot Dock on 10-17-2019.  On her 1 week follow up via venous duplex an EHIT-3 was noted and Judy Lucas is on Xarelto starter pack. She is scheduled to return for repeat venous duplex and [facility] FU with Dr. Scot Dock in 6 weeks.  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  She states she will be sitting during her work hours.  Encouraged her when she returned to work next week  to keep her leg elevated when sitting , wear her compression stockings, and get up to walk periodically while working. Judy Lucas verbalized understanding and will call [facility] for further questions or concerns.

## 2019-10-29 ENCOUNTER — Other Ambulatory Visit: Payer: Self-pay | Admitting: *Deleted

## 2019-10-29 DIAGNOSIS — I83813 Varicose veins of bilateral lower extremities with pain: Secondary | ICD-10-CM

## 2019-11-13 ENCOUNTER — Other Ambulatory Visit: Payer: Self-pay

## 2019-11-13 MED ORDER — RIVAROXABAN 20 MG PO TABS: 20.0000 mg | ORAL_TABLET | Freq: Every day | ORAL | 0 refills | Status: DC

## 2019-12-06 ENCOUNTER — Other Ambulatory Visit: Payer: Self-pay | Admitting: Vascular Surgery

## 2019-12-11 ENCOUNTER — Telehealth (HOSPITAL_COMMUNITY): Payer: Self-pay

## 2019-12-11 NOTE — Telephone Encounter (Signed)

## 2019-12-12 ENCOUNTER — Other Ambulatory Visit: Payer: Self-pay

## 2019-12-12 ENCOUNTER — Ambulatory Visit (INDEPENDENT_AMBULATORY_CARE_PROVIDER_SITE_OTHER): Payer: PRIVATE HEALTH INSURANCE | Admitting: Vascular Surgery

## 2019-12-12 ENCOUNTER — Encounter: Payer: Self-pay | Admitting: Vascular Surgery

## 2019-12-12 ENCOUNTER — Ambulatory Visit (HOSPITAL_COMMUNITY)
Admission: RE | Admit: 2019-12-12 | Discharge: 2019-12-12 | Disposition: A | Discharge status: Home / Self Care | Payer: PRIVATE HEALTH INSURANCE | Source: Ambulatory Visit | Attending: Surgery | Admitting: Surgery

## 2019-12-12 VITALS — BP 120/72 | HR 50 | Temp 97.3°F | Resp 18 | Ht 62.5 in | Wt 249.0 lb

## 2019-12-12 DIAGNOSIS — I83813 Varicose veins of bilateral lower extremities with pain: Secondary | ICD-10-CM

## 2019-12-12 NOTE — Progress Notes (Signed)
Patient name: Judy Lucas MRN: [id number] DOB: 1956-06-15 Sex: female  REASON FOR VISIT: Follow-up after laser ablation.  HPI: Judy Lucas is a 64 y.o. female who had presented with bilateral venous stasis disease.  She had failed conservative treatment and was felt to be a reasonable candidate for laser ablation of the left great saphenous vein.  Of note the vein was very deep I thought this would be technically challenging.  She was also noted to have a small anterior sensory saphenous vein.  On 10/17/2019 the patient had attempted laser ablation of the left great saphenous vein.  Access was difficult given the depth of the vein and ultimately I was able unable to perform laser ablation of the left great saphenous vein.  I elected to address the short anterior sensory saphenous vein.  Again because of her obesity visualization was somewhat challenging I was very careful to stay away from the saphenofemoral junction.  She came in for a follow-up visit on 10/24/2019 and was noted to have an EHIT-3.  I therefore treated her with 4 weeks of Xarelto with the starter pack.  She comes in for a 6-week follow-up visit.  She has finished her Xarelto.  She said no significant pain or swelling in either leg.  She has been wearing her compression stockings when she is on her feet and elevating her legs.  She would like to consider sclerotherapy of the spider veins in the right leg in the fall.   Current Outpatient Medications  Medication Sig Dispense Refill  . allopurinol (ZYLOPRIM) 300 MG tablet Take 150 mg by mouth See admin instructions. Takes 150mg  every other day (300mg  tablets, pt splints in half and takes half every other day)    . aspirin EC 81 MG tablet Take 81 mg by mouth daily.    Marland Kitchen b complex vitamins capsule Take 1 capsule by mouth daily.    . Cholecalciferol (VITAMIN D3) 50 MCG (2000 UT) capsule Take 2,000 Units by mouth daily.    Marland Kitchen DHEA 10 MG TABS Take 10 mg by mouth at bedtime.    .  fluticasone (FLONASE) 50 MCG/ACT nasal spray USE TWO SPRAYS IN EACH NOSTRIL EVERY DAY    . loratadine (CLARITIN) 10 MG tablet Take 1 tablet by mouth daily.    . meclizine (ANTIVERT) 12.5 MG tablet Take 12.5 mg by mouth 3 (three) times daily as needed.    . Melatonin 10 MG CAPS Take 10 mg by mouth at bedtime.    . mirabegron ER (MYRBETRIQ) 25 MG TB24 tablet Take 25 mg by mouth daily.    . montelukast (SINGULAIR) 10 MG tablet Take 10 mg by mouth at bedtime.    Marland Kitchen olmesartan (BENICAR) 20 MG tablet Take 20 mg by mouth daily.    Marland Kitchen omeprazole (PRILOSEC) 40 MG capsule Take 40 mg by mouth daily.    . simvastatin (ZOCOR) 10 MG tablet Take 10 mg by mouth daily.    Marland Kitchen spironolactone (ALDACTONE) 50 MG tablet Take 50 mg by mouth 2 (two) times daily.     Alveda Reasons 20 MG TABS tablet TAKE ONE TABLET BY MOUTH EVERY DAY W SUPPER (Patient not taking: Reported on 12/12/2019) 15 tablet 0   No current facility-administered medications for this visit.   REVIEW OF SYSTEMS: Valu.Nieves ] denotes positive finding; [  ] denotes negative finding  CARDIOVASCULAR:  [ ]  chest pain   [ ]  dyspnea on exertion  [ ]  leg swelling  CONSTITUTIONAL:  [ ]  fever   [ ]   chills  PHYSICAL EXAM: Vitals:   12/12/19 1023  BP: 120/72  Pulse: (!) 50  Resp: 18  Temp: (!) 97.3 F (36.3 C)  TempSrc: Temporal  SpO2: 98%  Weight: 249 lb (112.9 kg)  Height: 5' 2.5" (1.588 m)   GENERAL: The patient is a well-nourished female, in no acute distress. The vital signs are documented above. CARDIOVASCULAR: There is a regular rate and rhythm. PULMONARY: There is good air exchange bilaterally without wheezing or rales. VASCULAR: She has no significant bruising in the left thigh.  She has no significant left leg swelling. Elected with the [place] at her great saphenous vein and there is residual thrombus here.  Given that she has had some recanalization we could consider repeat laser ablation with the new laser, however given the residual thrombus in the  vein again I think would be difficult to get through the vein especially given the depth also.  DATA:  VENOUS DUPLEX: I have independently interpreted her venous duplex scan today.  The each hip has completely resolved.  There is been some recanalization of the left great saphenous vein.  The anterior sensory saphenous vein is patent.  MEDICAL ISSUES:  CHRONIC VENOUS INSUFFICIENCY: Patient continues to have some pain varicose veins in the right leg and would like to consider sclerotherapy.  She did have some segments of reflux in the distal right great saphenous vein at the saphenofemoral junction back in February but in the thigh the vein was largely competent.  She would like come back in October and I will get a follow-up reflux study at that time.  If there have been no significant changes I think she could be simply scheduled for sclerotherapy.  However if she has significant superficial venous reflux I think we would have to consider laser ablation on the right prior to sclerotherapy given the risk of recurrence.  She is having significant aching pain in the right leg with standing and sitting and she long hours on her feet as she works multiple jobs.  I will see her back in October.  She knows to call sooner if she has problems.  Deitra Mayo [facility] [place] [phone]

## 2019-12-16 ENCOUNTER — Other Ambulatory Visit: Payer: Self-pay | Admitting: *Deleted

## 2019-12-16 DIAGNOSIS — I872 Venous insufficiency (chronic) (peripheral): Secondary | ICD-10-CM

## 2020-03-25 ENCOUNTER — Other Ambulatory Visit: Payer: Self-pay | Admitting: Cardiology

## 2020-03-25 DIAGNOSIS — I209 Angina pectoris, unspecified: Secondary | ICD-10-CM

## 2020-04-07 ENCOUNTER — Encounter: Payer: Self-pay | Admitting: Cardiology

## 2020-04-07 ENCOUNTER — Other Ambulatory Visit: Payer: Self-pay

## 2020-04-07 ENCOUNTER — Ambulatory Visit: Payer: PRIVATE HEALTH INSURANCE | Admitting: Cardiology

## 2020-04-07 VITALS — BP 108/62 | HR 50 | Ht 62.5 in | Wt 252.0 lb

## 2020-04-07 DIAGNOSIS — R9389 Abnormal findings on diagnostic imaging of other specified body structures: Secondary | ICD-10-CM | POA: Diagnosis not present

## 2020-04-07 DIAGNOSIS — R0789 Other chest pain: Secondary | ICD-10-CM

## 2020-04-07 NOTE — Patient Instructions (Signed)

## 2020-04-07 NOTE — Progress Notes (Signed)
Cardiology Office Note:    Date:  04/07/2020   ID:  Judy Lucas, DOB September 05, 1955, MRN [id number]  PCP:  Nicholos Johns, MD  Cardiologist:  Jenean Lindau, MD   Referring MD: Nicholos Johns, MD    ASSESSMENT:    1. Abnormal CT of the chest   2. Chest discomfort   3. Morbid obesity ([place])    PLAN:    In order of problems listed above:  1. Primary prevention stressed with the patient.  Importance of compliance with diet medication stressed and she vocalized understanding 2. Chest discomfort: This has resolved and she is happy about it.  Her calcium score is 0 and I discussed this and reassured her.  She is satisfied.  Russians were answered to her satisfaction. 3. Abnormal CT chest followed by pulmonary and she has a follow-up appointment coming up. 4. Morbid obesity: Diet was emphasized at extensive length and she vocalized understanding and promises to do better.  Importance of regular exercise stressed 5. Patient will be seen in follow-up appointment in 6 months or earlier if the patient has any concerns    Medication Adjustments/Labs and Tests Ordered: Current medicines are reviewed at length with the patient today.  Concerns regarding medicines are outlined above.  No orders of the defined types were placed in this encounter.  No orders of the defined types were placed in this encounter.    No chief complaint on file.    History of Present Illness:    Judy Lucas is a 64 y.o. female.  Patient has past medical history of chest discomfort, essential hypertension and morbid obesity.  She has had renal insufficiency in the past the last blood work from her primary care physician's office was fine.  She denies any chest pain orthopnea or PND.  She leads a sedentary lifestyle.  She is planning retirement and wants to get back to exercise in an active lifestyle.  At the time of my evaluation, the patient is alert awake oriented and in no distress.  Past Medical History:    Diagnosis Date  . Allergic rhinitis   . Allergy   . Chest pain 01/22/2019  . Dysphagia 01/22/2019  . Hyperlipidemia   . Hypertension   . Osteoarthritis 01/22/2019  . Renal insufficiency 01/22/2019    Past Surgical History:  Procedure Laterality Date  . ABDOMINAL HYSTERECTOMY    . APPENDECTOMY    . CHOLECYSTECTOMY    . ENDOVENOUS ABLATION SAPHENOUS VEIN W/ LASER Left 10/17/2019   endovenous laser ablation anterior accessory branch of left greater saphenous vein by Gae Gallop MD   . feet surgery    . WRIST SURGERY      Current Medications: Current Meds  Medication Sig  . aspirin EC 81 MG tablet Take 81 mg by mouth daily.  Marland Kitchen b complex vitamins capsule Take 1 capsule by mouth daily.  . B Complex-Biotin-FA (SUPER B-50 B COMPLEX) CAPS Take 1-2 capsules by mouth daily.  . Cholecalciferol (VITAMIN D3) 50 MCG (2000 UT) capsule Take 2,000 Units by mouth daily.  Marland Kitchen DHEA 10 MG TABS Take 10 mg by mouth at bedtime.  . fluticasone (FLONASE) 50 MCG/ACT nasal spray USE TWO SPRAYS IN EACH NOSTRIL EVERY DAY  . loratadine (CLARITIN) 10 MG tablet Take 1 tablet by mouth daily.  . meclizine (ANTIVERT) 12.5 MG tablet Take 12.5 mg by mouth 3 (three) times daily as needed.  . Melatonin 10 MG CAPS Take 10 mg by mouth at bedtime.  . mirabegron ER (  MYRBETRIQ) 25 MG TB24 tablet Take 25 mg by mouth daily.  . montelukast (SINGULAIR) 10 MG tablet Take 10 mg by mouth at bedtime.  Marland Kitchen olmesartan (BENICAR) 20 MG tablet Take 20 mg by mouth daily.  Marland Kitchen omeprazole (PRILOSEC) 40 MG capsule Take 40 mg by mouth daily.  . simvastatin (ZOCOR) 10 MG tablet Take 10 mg by mouth daily.  Marland Kitchen spironolactone (ALDACTONE) 50 MG tablet Take 50 mg by mouth 2 (two) times daily.      Allergies:   Meperidine, Morphine, Yellow dyes (non-tartrazine), Thyroid, Furosemide, Nitroglycerin, Penicillin g, and Sulfamethoxazole   Social History   Socioeconomic History  . Marital status: Married    Spouse name: Not on file  . Number of children:  Not on file  . Years of education: Not on file  . Highest education level: Not on file  Occupational History  . Not on file  Tobacco Use  . Smoking status: Never Smoker  . Smokeless tobacco: Never Used  Vaping Use  . Vaping Use: Never used  Substance and Sexual Activity  . Alcohol use: Never  . Drug use: Never  . Sexual activity: Not on file  Other Topics Concern  . Not on file  Social History Narrative  . Not on file   Social Determinants of Health   Financial Resource Strain:   . Difficulty of Paying Living Expenses: Not on file  Food Insecurity:   . Worried About Charity fundraiser in the Last Year: Not on file  . Ran Out of Food in the Last Year: Not on file  Transportation Needs:   . Lack of Transportation (Medical): Not on file  . Lack of Transportation (Non-Medical): Not on file  Physical Activity:   . Days of Exercise per Week: Not on file  . Minutes of Exercise per Session: Not on file  Stress:   . Feeling of Stress : Not on file  Social Connections:   . Frequency of Communication with Friends and Family: Not on file  . Frequency of Social Gatherings with Friends and Family: Not on file  . Attends Religious Services: Not on file  . Active Member of Clubs or Organizations: Not on file  . Attends Archivist Meetings: Not on file  . Marital Status: Not on file     Family History: The patient's family history includes Heart attack in her father and mother; Heart disease in her father and mother; Hyperlipidemia in her brother, father, mother, and sister; Hypertension in her father, mother, and sister; Skin cancer in her father.  ROS:   Please see the history of present illness.    All other systems reviewed and are negative.  EKGs/Labs/Other Studies Reviewed:    The following studies were reviewed today: EKG reveals sinus rhythm poor anterior forces and nonspecific ST-T changes   Recent Labs: No results found for requested labs within last 8760  hours.  Recent Lipid Panel No results found for: CHOL, TRIG, HDL, CHOLHDL, VLDL, LDLCALC, LDLDIRECT  Physical Exam:    VS:  BP 108/62   Pulse (!) 50   Ht 5' 2.5" (1.588 m)   Wt 252 lb (114.3 kg)   SpO2 98%   BMI 45.36 kg/m     Wt Readings from Last 3 Encounters:  04/07/20 252 lb (114.3 kg)  12/12/19 249 lb (112.9 kg)  10/24/19 249 lb 8 oz (113.2 kg)     GEN: Patient is in no acute distress HEENT: Normal NECK: No JVD; No  carotid bruits LYMPHATICS: No lymphadenopathy CARDIAC: Hear sounds regular, 2/6 systolic murmur at the apex. RESPIRATORY:  Clear to auscultation without rales, wheezing or rhonchi  ABDOMEN: Soft, non-tender, non-distended MUSCULOSKELETAL:  No edema; No deformity  SKIN: Warm and dry NEUROLOGIC:  Alert and oriented x 3 PSYCHIATRIC:  Normal affect   Signed, Jenean Lindau, MD  04/07/2020 11:35 AM    [place]

## 2020-07-16 ENCOUNTER — Ambulatory Visit (INDEPENDENT_AMBULATORY_CARE_PROVIDER_SITE_OTHER)
Admission: RE | Admit: 2020-07-16 | Discharge: 2020-07-16 | Disposition: A | Discharge status: Home / Self Care | Payer: Medicare HMO | Source: Ambulatory Visit | Attending: Critical Care Medicine | Admitting: Critical Care Medicine

## 2020-07-16 ENCOUNTER — Other Ambulatory Visit: Payer: Self-pay

## 2020-07-16 DIAGNOSIS — R911 Solitary pulmonary nodule: Secondary | ICD-10-CM | POA: Diagnosis not present

## 2020-07-16 DIAGNOSIS — R9389 Abnormal findings on diagnostic imaging of other specified body structures: Secondary | ICD-10-CM

## 2020-07-16 DIAGNOSIS — I700 Atherosclerosis of aorta: Secondary | ICD-10-CM | POA: Diagnosis not present

## 2020-07-16 DIAGNOSIS — Z9049 Acquired absence of other specified parts of digestive tract: Secondary | ICD-10-CM | POA: Diagnosis not present

## 2020-07-21 NOTE — Progress Notes (Signed)
Can you please let Judy Lucas know that her nodule in her lungs is stable. We still need to see her in the office to follow up about some of the scarring in her lungs. She will eventually need another follow up CT scan in about a year. I would recommend she follow up with Dr. Vaughan Browner or Dr. Chase Caller in the next 1-2 months (30 min visit) to establish care. Thanks! LPC

## 2020-07-27 ENCOUNTER — Ambulatory Visit: Payer: PRIVATE HEALTH INSURANCE | Admitting: Critical Care Medicine

## 2020-07-27 ENCOUNTER — Other Ambulatory Visit: Payer: Self-pay

## 2020-07-27 ENCOUNTER — Encounter: Payer: Self-pay | Admitting: Critical Care Medicine

## 2020-07-27 VITALS — BP 118/80 | HR 56 | Temp 98.2°F | Ht 62.0 in | Wt 247.2 lb

## 2020-07-27 DIAGNOSIS — R9389 Abnormal findings on diagnostic imaging of other specified body structures: Secondary | ICD-10-CM | POA: Diagnosis not present

## 2020-07-27 DIAGNOSIS — R0600 Dyspnea, unspecified: Secondary | ICD-10-CM

## 2020-07-27 DIAGNOSIS — Z6841 Body Mass Index (BMI) 40.0 and over, adult: Secondary | ICD-10-CM | POA: Diagnosis not present

## 2020-07-27 DIAGNOSIS — R0609 Other forms of dyspnea: Secondary | ICD-10-CM

## 2020-07-27 LAB — CK: Total CK: 75 U/L (ref 7–177)

## 2020-07-27 LAB — C-REACTIVE PROTEIN: CRP: <1 mg/dL (ref 0.5–20.0)

## 2020-07-27 LAB — SEDIMENTATION RATE: Sed Rate: 24 mm/hr (ref 0–30)

## 2020-07-27 NOTE — Patient Instructions (Addendum)
Thank you for visiting Dr. Carlis Abbott at [facility]. We recommend the following: Orders Placed This Encounter  Procedures   CK   C-reactive protein   Cyclic citrul peptide antibody, IgG   Sedimentation rate   Sjogrens syndrome-A extractable nuclear antibody   Sjogrens syndrome-B extractable nuclear antibody   Rheumatoid factor   Pulmonary function test   6 minute walk   Orders Placed This Encounter  Procedures   CK    Standing Status:   Future    Standing Expiration Date:   07/27/2021   C-reactive protein    Standing Status:   Future    Standing Expiration Date:   16/05/9603   Cyclic citrul peptide antibody, IgG    Standing Status:   Future    Standing Expiration Date:   07/27/2021   Sedimentation rate    Standing Status:   Future    Standing Expiration Date:   07/27/2021   Sjogrens syndrome-A extractable nuclear antibody   Sjogrens syndrome-B extractable nuclear antibody   Rheumatoid factor    Standing Status:   Future    Standing Expiration Date:   07/27/2021   Pulmonary function test    Standing Status:   Future    Standing Expiration Date:   07/27/2021    Order Specific Question:   Where should this test be performed?    Answer:   [facility]    Order Specific Question:   Full PFT: includes the following: basic spirometry, spirometry pre & post bronchodilator, diffusion capacity (DLCO), lung volumes    Answer:   Full PFT   6 minute walk    Standing Status:   Future    Standing Expiration Date:   07/27/2021      Return in about 1 month (around 08/27/2020). with Dr. Chase Caller (30 minutes visit).    Please do your part to reduce the spread of COVID-19.

## 2020-07-27 NOTE — Progress Notes (Signed)
Synopsis: Referred in January 2021 for abnormal CT by Nicholos Johns, MD.  Subjective:   PATIENT ID: Judy Lucas GENDER: female DOB: 07/17/1956, MRN: [id number]  Chief Complaint  Patient presents with  . Follow-up    Patient has shortness of breath with exertion, had Covid end of August/first of September. Denies cough.    Judy Lucas is a 64 year old woman with a history of Covid who presents for follow-up.  She is accompanied today by her husband.  She initially had a viral illness there is suspicious for Covid in February 2020.  She was reinfected in September 2021 despite vaccination.  In September 2021 she was treated with monoclonal antibody.  Her main complaint today is having occasional left anterior sharp chest pain when taking a deep breath, but not with tidal breathing.  She has been evaluated by cardiology, who did not feel that this was cardiac in etiology.  She has not noticed being short of breath, but her coworkers feel that she has had dyspnea with exertion.  She feels that she has more learned to adapt to this and her activities are not limited.  No significant cough.  She continues on omeprazole for reflux.  She is up-to-date on covidvaccines x3.  She is up-to-date on her seasonal flu shot.    OV 09/12/19: Judy Lucas is a 64 y/o woman referred for evaluation of an abnormal CT scan. She had a coronary calcium scoring CT scan to evaluate chest pain that has since been deemed noncardiac. She had an incidentally discovered LLL lung nodule, prompting a 28-month follow up CT scan which demonstrated another abnormality. She had another 74-month scan with persistence. She has no previous history of malignancy or tobacco abuse. She had a significant amount of second-hand smoke exposure as a child as both parents smoked. There is no family history of malignancy. She has a history of multiple pneumonias in the past, including in June and October 2020. She is concerned that she had covid in  February 2020 before we were able to test for it, but she had loss of taste & smell, was flu negative, and was sick in bed for >1 week. She works a Marine scientist in [employer]. She has a history of seasonal allergies starting in February and multiple food allergies causing airway swelling, SOB, and cough.   H/o dysphagia, GERD, esophageal strictures requiring dilation. She was recently evaluated by GI and felt to have possible esophageal spasms. She is not currently on a PPI. She has been working on losing weight through lifestyle modifications.  Cardiologist Dr. Geraldo Pitter, office note reviewed 08/27/2019     Past Medical History:  Diagnosis Date  . Allergic rhinitis   . Allergy   . Chest pain 01/22/2019  . Dysphagia 01/22/2019  . Hyperlipidemia   . Hypertension   . Osteoarthritis 01/22/2019  . Renal insufficiency 01/22/2019     Family History  Problem Relation Age of Onset  . Hyperlipidemia Mother   . Hypertension Mother   . Heart disease Mother   . Heart attack Mother   . Skin cancer Father   . Hyperlipidemia Father   . Hypertension Father   . Heart disease Father   . Heart attack Father   . Hypertension Sister   . Hyperlipidemia Sister   . Hyperlipidemia Brother      Past Surgical History:  Procedure Laterality Date  . ABDOMINAL HYSTERECTOMY    . APPENDECTOMY    . CHOLECYSTECTOMY    . ENDOVENOUS ABLATION SAPHENOUS  VEIN W/ LASER Left 10/17/2019   endovenous laser ablation anterior accessory branch of left greater saphenous vein by Gae Gallop MD   . feet surgery    . WRIST SURGERY      Social History   Socioeconomic History  . Marital status: Married    Spouse name: Not on file  . Number of children: Not on file  . Years of education: Not on file  . Highest education level: Not on file  Occupational History  . Not on file  Tobacco Use  . Smoking status: Never Smoker  . Smokeless tobacco: Never Used  Vaping Use  . Vaping Use: Never used  Substance and Sexual Activity   . Alcohol use: Never  . Drug use: Never  . Sexual activity: Not on file  Other Topics Concern  . Not on file  Social History Narrative  . Not on file   Social Determinants of Health   Financial Resource Strain: Not on file  Food Insecurity: Not on file  Transportation Needs: Not on file  Physical Activity: Not on file  Stress: Not on file  Social Connections: Not on file  Intimate Partner Violence: Not on file     Allergies  Allergen Reactions  . Meperidine Shortness Of Breath  . Morphine Shortness Of Breath  . Yellow Dyes (Non-Tartrazine) Anaphylaxis  . Thyroid Swelling  . Furosemide Rash    SWELLING ALLERGY SWELLING ALLERGY   . Nitroglycerin Palpitations  . Penicillin G Rash  . Sulfamethoxazole Rash     Immunization History  Administered Date(s) Administered  . Fluad Quad(high Dose 65+) 05/27/2020  . Influenza,inj,Quad PF,6-35 Mos 05/16/2019  . Moderna SARS-COV2 Booster Vaccination 07/22/2020  . PFIZER SARS-COV-2 Vaccination 09/09/2019, 09/23/2019  . Zoster Recombinat (Shingrix) 05/14/2018    Outpatient Medications Prior to Visit  Medication Sig Dispense Refill  . aspirin EC 81 MG tablet Take 81 mg by mouth daily.    Marland Kitchen b complex vitamins capsule Take 1 capsule by mouth daily.    . B Complex-Biotin-FA (SUPER B-50 B COMPLEX) CAPS Take 1-2 capsules by mouth daily.    . Cholecalciferol (VITAMIN D3) 50 MCG (2000 UT) capsule Take 2,000 Units by mouth daily.    Marland Kitchen DHEA 10 MG TABS Take 10 mg by mouth at bedtime.    . fluticasone (FLONASE) 50 MCG/ACT nasal spray USE TWO SPRAYS IN EACH NOSTRIL EVERY DAY    . loratadine (CLARITIN) 10 MG tablet Take 1 tablet by mouth daily.    . meclizine (ANTIVERT) 12.5 MG tablet Take 12.5 mg by mouth 3 (three) times daily as needed.    . Melatonin 10 MG CAPS Take 10 mg by mouth at bedtime.    . mirabegron ER (MYRBETRIQ) 25 MG TB24 tablet Take 25 mg by mouth daily.    . montelukast (SINGULAIR) 10 MG tablet Take 10 mg by mouth at  bedtime.    Marland Kitchen olmesartan (BENICAR) 20 MG tablet Take 20 mg by mouth daily.    Marland Kitchen omeprazole (PRILOSEC) 40 MG capsule Take 40 mg by mouth daily.    . simvastatin (ZOCOR) 10 MG tablet Take 10 mg by mouth daily.    Marland Kitchen spironolactone (ALDACTONE) 50 MG tablet Take 50 mg by mouth 2 (two) times daily.      No facility-administered medications prior to visit.    Review of Systems  Constitutional: Negative for chills and fever.       Planned weight loss  HENT: Negative for congestion.   Respiratory: Positive for  shortness of breath. Negative for wheezing.   Cardiovascular: Positive for chest pain. Negative for palpitations and leg swelling.  Gastrointestinal: Negative for heartburn, nausea and vomiting.  Endo/Heme/Allergies: Positive for environmental allergies.     Objective:   Vitals:   07/27/20 0947  BP: 118/80  Pulse: (!) 56  Temp: 98.2 F (36.8 C)  TempSrc: Temporal  SpO2: 98%  Weight: 247 lb 3.2 oz (112.1 kg)  Height: 5\' 2"  (1.575 m)   98% on  RA BMI Readings from Last 3 Encounters:  07/27/20 45.21 kg/m  04/07/20 45.36 kg/m  12/12/19 44.82 kg/m   Wt Readings from Last 3 Encounters:  07/27/20 247 lb 3.2 oz (112.1 kg)  04/07/20 252 lb (114.3 kg)  12/12/19 249 lb (112.9 kg)    Physical Exam Vitals reviewed.  Constitutional:      General: She is not in acute distress.    Appearance: She is obese. She is not ill-appearing.  HENT:     Head: Normocephalic and atraumatic.  Eyes:     General: No scleral icterus. Cardiovascular:     Rate and Rhythm: Normal rate and regular rhythm.     Heart sounds: No murmur heard.   Pulmonary:     Comments: Breathing comfortably on room air, no conversational dyspnea.  Clear to auscultation bilaterally. Abdominal:     General: There is no distension.     Palpations: Abdomen is soft.     Tenderness: There is no abdominal tenderness.  Musculoskeletal:        General: No swelling or deformity.     Cervical back: Neck supple.   Lymphadenopathy:     Cervical: No cervical adenopathy.  Skin:    General: Skin is warm and dry.     Findings: No rash.  Neurological:     General: No focal deficit present.     Mental Status: She is alert.     Motor: No weakness.     Coordination: Coordination normal.  Psychiatric:        Mood and Affect: Mood normal.        Behavior: Behavior normal.      CBC    Component Value Date/Time   WBC 6.3 01/22/2019 1643   RBC 3.37 (L) 01/22/2019 1643   HGB 11.1 01/22/2019 1643   HCT 32.5 (L) 01/22/2019 1643   PLT 213 01/22/2019 1643   MCV 96 01/22/2019 1643   MCH 32.9 01/22/2019 1643   MCHC 34.2 01/22/2019 1643   RDW 13.7 01/22/2019 1643    CHEMISTRY No results for input(s): NA, K, CL, CO2, GLUCOSE, BUN, CREATININE, CALCIUM, MG, PHOS in the last 168 hours. CrCl cannot be calculated (Patient's most recent lab result is older than the maximum 21 days allowed.).   Chest Imaging- films reviewed: CTA coronary 02/04/2019- dependent subpleural left lateral opacity, likely atelectasis.  Mild GGO throughout the lungs. No significant CAD-calcium score 0.  CT chests from [facility] reviewed, including 07/2019 CT- resolved LLL subpleural opacity. Persistence of irregular GGN with part-solid component, possibly an ~1cm intraparenchymal LN. No growth over 57-month time frame.  07/16/2020 CT chest-stable nodules in left upper lobe and right lower lobe. Bilateral patchy areas of groundglass with intralobular septal thickening in RLL, LLL, lateral RML, all in antidependent locations.  Pulmonary Functions Testing Results: No flowsheet data found.       Assessment & Plan:     ICD-10-CM   1. Abnormal CT of the chest  R93.89 Pulmonary function test    6  minute walk    CK    C-reactive protein    Cyclic citrul peptide antibody, IgG    Sedimentation rate    Sjogrens syndrome-A extractable nuclear antibody    Sjogrens syndrome-B extractable nuclear antibody    Rheumatoid factor     Rheumatoid factor    Sedimentation rate    Cyclic citrul peptide antibody, IgG    C-reactive protein    CK  2. DOE (dyspnea on exertion)  R06.00 Pulmonary function test    6 minute walk    CK    C-reactive protein    Cyclic citrul peptide antibody, IgG    Sedimentation rate    Sjogrens syndrome-A extractable nuclear antibody    Sjogrens syndrome-B extractable nuclear antibody    Rheumatoid factor    Rheumatoid factor    Sedimentation rate    Cyclic citrul peptide antibody, IgG    C-reactive protein    CK    Abnormal CT chest- bibasilar dependent areas of GGO with mild interlobular septal thickening. Medial RLL with more cystic appearance. She has had multiple pulmonary infections in the past.   -Reviewed updated CT scan during the visit- she has persistence and possibly progression over time of peripheral opacities. Although she does not describe feeling dyspneic, this remains concerning if others have noticed that. Recommend PFTs and autoimmune serologies be evaluated. She will follow up with [facility] center- Dr. Chase Caller. -recommend treatment of underlying GERD. -Up-to-date on flu, Covid vaccines  RTC in February 2021 after PFTs with Dr. Chase Caller.   Current Outpatient Medications:  .  aspirin EC 81 MG tablet, Take 81 mg by mouth daily., Disp: , Rfl:  .  b complex vitamins capsule, Take 1 capsule by mouth daily., Disp: , Rfl:  .  B Complex-Biotin-FA (SUPER B-50 B COMPLEX) CAPS, Take 1-2 capsules by mouth daily., Disp: , Rfl:  .  Cholecalciferol (VITAMIN D3) 50 MCG (2000 UT) capsule, Take 2,000 Units by mouth daily., Disp: , Rfl:  .  DHEA 10 MG TABS, Take 10 mg by mouth at bedtime., Disp: , Rfl:  .  fluticasone (FLONASE) 50 MCG/ACT nasal spray, USE TWO SPRAYS IN EACH NOSTRIL EVERY DAY, Disp: , Rfl:  .  loratadine (CLARITIN) 10 MG tablet, Take 1 tablet by mouth daily., Disp: , Rfl:  .  meclizine (ANTIVERT) 12.5 MG tablet, Take 12.5 mg by mouth 3 (three) times daily as needed., Disp:  , Rfl:  .  Melatonin 10 MG CAPS, Take 10 mg by mouth at bedtime., Disp: , Rfl:  .  mirabegron ER (MYRBETRIQ) 25 MG TB24 tablet, Take 25 mg by mouth daily., Disp: , Rfl:  .  montelukast (SINGULAIR) 10 MG tablet, Take 10 mg by mouth at bedtime., Disp: , Rfl:  .  olmesartan (BENICAR) 20 MG tablet, Take 20 mg by mouth daily., Disp: , Rfl:  .  omeprazole (PRILOSEC) 40 MG capsule, Take 40 mg by mouth daily., Disp: , Rfl:  .  simvastatin (ZOCOR) 10 MG tablet, Take 10 mg by mouth daily., Disp: , Rfl:  .  spironolactone (ALDACTONE) 50 MG tablet, Take 50 mg by mouth 2 (two) times daily. , Disp: , Rfl:      Julian Hy, DO [facility] 07/27/2020 1:14 PM

## 2020-07-28 DIAGNOSIS — H60502 Unspecified acute noninfective otitis externa, left ear: Secondary | ICD-10-CM | POA: Diagnosis not present

## 2020-07-28 DIAGNOSIS — H6121 Impacted cerumen, right ear: Secondary | ICD-10-CM | POA: Diagnosis not present

## 2020-07-28 DIAGNOSIS — J309 Allergic rhinitis, unspecified: Secondary | ICD-10-CM | POA: Diagnosis not present

## 2020-07-28 DIAGNOSIS — H6693 Otitis media, unspecified, bilateral: Secondary | ICD-10-CM | POA: Diagnosis not present

## 2020-07-28 LAB — CYCLIC CITRUL PEPTIDE ANTIBODY, IGG: Cyclic Citrullin Peptide Ab: <16 UNITS

## 2020-07-28 LAB — SJOGRENS SYNDROME-B EXTRACTABLE NUCLEAR ANTIBODY: SSB (La) (ENA) Antibody, IgG: <1 AI

## 2020-07-28 LAB — SJOGRENS SYNDROME-A EXTRACTABLE NUCLEAR ANTIBODY: SSA (Ro) (ENA) Antibody, IgG: <1 AI

## 2020-07-28 LAB — RHEUMATOID FACTOR: Rhuematoid fact SerPl-aCnc: <14 IU/mL (ref <14)

## 2020-07-30 NOTE — Progress Notes (Signed)
Please let Judy Lucas know that all of her labs are normal. Dr. Chase Caller will be able to see these results when he sees her in the office at her next appointment. Thanks!  LPC

## 2020-08-04 ENCOUNTER — Ambulatory Visit (INDEPENDENT_AMBULATORY_CARE_PROVIDER_SITE_OTHER): Payer: Medicare HMO

## 2020-08-04 ENCOUNTER — Other Ambulatory Visit: Payer: Self-pay

## 2020-08-04 DIAGNOSIS — R0609 Other forms of dyspnea: Secondary | ICD-10-CM

## 2020-08-04 DIAGNOSIS — R9389 Abnormal findings on diagnostic imaging of other specified body structures: Secondary | ICD-10-CM | POA: Diagnosis not present

## 2020-08-04 DIAGNOSIS — R0600 Dyspnea, unspecified: Secondary | ICD-10-CM | POA: Diagnosis not present

## 2020-08-04 NOTE — Progress Notes (Signed)
Six Minute Walk - 08/04/20 0913      Six Minute Walk   Medications taken before test (dose and time) flonase 65mcg, claritin 10mg , myrbetriq 25mg , benicar 20mg  all at 6:30am    Supplemental oxygen during test? No    Lap distance in meters  34 meters    Laps Completed 13    Partial lap (in meters) 0 meters    Baseline BP (sitting) 120/70    Baseline Heartrate 53    Baseline Dyspnea (Borg Scale) 0    Baseline Fatigue (Borg Scale) 0    Baseline SPO2 100 %      End of Test Values    BP (sitting) 130/76    Heartrate 61    Dyspnea (Borg Scale) 2    Fatigue (Borg Scale) 0    SPO2 98 %      2 Minutes Post Walk Values   BP (sitting) 122/76    Heartrate 56    SPO2 99 %    Stopped or paused before six minutes? No      Interpretation   Distance completed 442 meters    Tech Comments: Pt walked at a moderate pace completing all 6 minutes without stopping and did not have any complaints.

## 2020-09-02 DIAGNOSIS — M19072 Primary osteoarthritis, left ankle and foot: Secondary | ICD-10-CM | POA: Diagnosis not present

## 2020-09-08 DIAGNOSIS — Z23 Encounter for immunization: Secondary | ICD-10-CM | POA: Diagnosis not present

## 2020-09-08 DIAGNOSIS — E785 Hyperlipidemia, unspecified: Secondary | ICD-10-CM | POA: Diagnosis not present

## 2020-09-08 DIAGNOSIS — I10 Essential (primary) hypertension: Secondary | ICD-10-CM | POA: Diagnosis not present

## 2020-09-08 DIAGNOSIS — E119 Type 2 diabetes mellitus without complications: Secondary | ICD-10-CM | POA: Diagnosis not present

## 2020-09-08 DIAGNOSIS — H6120 Impacted cerumen, unspecified ear: Secondary | ICD-10-CM | POA: Diagnosis not present

## 2020-09-08 DIAGNOSIS — J309 Allergic rhinitis, unspecified: Secondary | ICD-10-CM | POA: Diagnosis not present

## 2020-09-08 DIAGNOSIS — Z9181 History of falling: Secondary | ICD-10-CM | POA: Diagnosis not present

## 2020-09-08 DIAGNOSIS — Z6841 Body Mass Index (BMI) 40.0 and over, adult: Secondary | ICD-10-CM | POA: Diagnosis not present

## 2020-09-08 DIAGNOSIS — Z1331 Encounter for screening for depression: Secondary | ICD-10-CM | POA: Diagnosis not present

## 2020-09-14 DIAGNOSIS — Z6841 Body Mass Index (BMI) 40.0 and over, adult: Secondary | ICD-10-CM | POA: Diagnosis not present

## 2020-09-14 DIAGNOSIS — M19072 Primary osteoarthritis, left ankle and foot: Secondary | ICD-10-CM | POA: Diagnosis not present

## 2020-09-14 DIAGNOSIS — L989 Disorder of the skin and subcutaneous tissue, unspecified: Secondary | ICD-10-CM | POA: Diagnosis not present

## 2020-09-14 DIAGNOSIS — Z9181 History of falling: Secondary | ICD-10-CM | POA: Diagnosis not present

## 2020-09-14 DIAGNOSIS — H6090 Unspecified otitis externa, unspecified ear: Secondary | ICD-10-CM | POA: Diagnosis not present

## 2020-09-25 ENCOUNTER — Other Ambulatory Visit (HOSPITAL_COMMUNITY)
Admission: RE | Admit: 2020-09-25 | Discharge: 2020-09-25 | Disposition: A | Discharge status: Home / Self Care | Payer: Medicare HMO | Source: Ambulatory Visit | Attending: Internal Medicine | Admitting: Internal Medicine

## 2020-09-25 DIAGNOSIS — Z20822 Contact with and (suspected) exposure to covid-19: Secondary | ICD-10-CM | POA: Insufficient documentation

## 2020-09-25 DIAGNOSIS — Z01812 Encounter for preprocedural laboratory examination: Secondary | ICD-10-CM | POA: Diagnosis not present

## 2020-09-25 LAB — SARS CORONAVIRUS 2 (TAT 6-24 HRS): SARS Coronavirus 2: NEGATIVE

## 2020-09-29 ENCOUNTER — Ambulatory Visit: Payer: Medicare HMO | Admitting: Internal Medicine

## 2020-09-29 ENCOUNTER — Encounter: Payer: Self-pay | Admitting: Internal Medicine

## 2020-09-29 ENCOUNTER — Other Ambulatory Visit: Payer: Self-pay

## 2020-09-29 ENCOUNTER — Ambulatory Visit (INDEPENDENT_AMBULATORY_CARE_PROVIDER_SITE_OTHER): Payer: Medicare HMO | Admitting: Internal Medicine

## 2020-09-29 VITALS — BP 112/74 | HR 61 | Temp 97.4°F | Ht 63.0 in | Wt 246.0 lb

## 2020-09-29 DIAGNOSIS — R911 Solitary pulmonary nodule: Secondary | ICD-10-CM | POA: Diagnosis not present

## 2020-09-29 DIAGNOSIS — Z8616 Personal history of COVID-19: Secondary | ICD-10-CM | POA: Diagnosis not present

## 2020-09-29 DIAGNOSIS — Z8701 Personal history of pneumonia (recurrent): Secondary | ICD-10-CM

## 2020-09-29 DIAGNOSIS — R9389 Abnormal findings on diagnostic imaging of other specified body structures: Secondary | ICD-10-CM

## 2020-09-29 DIAGNOSIS — R0609 Other forms of dyspnea: Secondary | ICD-10-CM

## 2020-09-29 DIAGNOSIS — R0600 Dyspnea, unspecified: Secondary | ICD-10-CM

## 2020-09-29 LAB — PULMONARY FUNCTION TEST
DL/VA % pred: 122 %
DL/VA: 5.12 ml/min/mmHg/L
DLCO cor % pred: 98 %
DLCO cor: 18.86 ml/min/mmHg
DLCO unc % pred: 98 %
DLCO unc: 18.86 ml/min/mmHg
FEF 25-75 Post: 3.26 L/sec
FEF 25-75 Pre: 2.36 L/sec
FEF2575-%Change-Post: 37 %
FEF2575-%Pred-Post: 160 %
FEF2575-%Pred-Pre: 116 %
FEV1-%Change-Post: 8 %
FEV1-%Pred-Post: 93 %
FEV1-%Pred-Pre: 85 %
FEV1-Post: 2.15 L
FEV1-Pre: 1.97 L
FEV1FVC-%Change-Post: 1 %
FEV1FVC-%Pred-Pre: 111 %
FEV6-%Change-Post: 6 %
FEV6-%Pred-Post: 85 %
FEV6-%Pred-Pre: 79 %
FEV6-Post: 2.45 L
FEV6-Pre: 2.3 L
FEV6FVC-%Pred-Post: 104 %
FEV6FVC-%Pred-Pre: 104 %
FVC-%Change-Post: 6 %
FVC-%Pred-Post: 81 %
FVC-%Pred-Pre: 76 %
FVC-Post: 2.45 L
FVC-Pre: 2.3 L
Post FEV1/FVC ratio: 87 %
Post FEV6/FVC ratio: 100 %
Pre FEV1/FVC ratio: 86 %
Pre FEV6/FVC Ratio: 100 %
RV % pred: 71 %
RV: 1.48 L
TLC % pred: 77 %
TLC: 3.79 L

## 2020-09-29 NOTE — Addendum Note (Signed)
Addended by: Coralie Keens on: 09/29/2020 10:37 AM   Modules accepted: Orders

## 2020-09-29 NOTE — Progress Notes (Signed)
OV 09/29/2020  Subjective:  Patient ID: Judy Lucas, female , DOB: 02-Aug-1956 , age 65 y.o. , MRN: [id number] , ADDRESS: [place] [place] [address] PCP Nicholos Johns, MD Patient Care Team: Nicholos Johns, MD as PCP - General (Internal Medicine)  This Provider for this visit: Treatment Team:  Attending Provider: Brand Males, MD    09/29/2020 -   Chief Complaint  Patient presents with  . Follow-up    Doing well  .   ICD-10-CM   1. Solitary pulmonary nodule  R91.1   2. Abnormal CT of the chest  R93.89   3. Personal history of COVID-19  Z86.16   4. History of community acquired pneumonia  Z87.01      HPI Judy Lucas 65 y.o. -follow-up for the above issues.  She was a patient of Dr. Carlis Abbott.  Dr. Carlis Abbott is now in the ICU rotation service.  Patient is being transferred to Dr. Chase Caller.  She is solitary pulmonary nodule 15 mm in the right costophrenic angle.  She had follow-up 1 year scan in December 2021 and it is stable.  She has early interstitial lung abnormalities in the lung bases.  According to the radiologist it is worse.  According to the patient she is asymptomatic without any shortness of breath or cough wheezing orthopnea proximal nocturnal dyspnea.  She denies any acid reflux.  She denies any tobacco smoking.  She is a retired Sports coach and former Marine scientist as well.  There is no mold or mildew in the house.  No feather pillow.  She has a maple tree allergy that seasonal but otherwise she is fine.  She gives a history of community-acquired pneumonia and hospitalization 15 years ago.  Also COVID-19 in February 2020 and repeat in August/September 2021 for which she was not hospitalized.  She believes the scar tissue might be related to that.    CT Chest data  Narrative & Impression  CLINICAL DATA:  Pulmonary nodule, follow-up examination  EXAM: CT CHEST WITHOUT CONTRAST  TECHNIQUE: Multidetector CT imaging of the chest was performed following  the standard protocol without IV contrast.  COMPARISON:  07/19/2019, 06/18/2015  FINDINGS: Cardiovascular: No significant coronary artery calcification. Global cardiac size within normal limits. No pericardial effusion. Central pulmonary arteries are of normal caliber. Mild atherosclerotic calcifications seen within the thoracic aorta. No aortic aneurysm.  Mediastinum/Nodes: Visualized thyroid unremarkable. No pathologic thoracic adenopathy. Esophagus unremarkable.  Lungs/Pleura: Ovoid nodule seen within the right costophrenic angle at axial image # 113 is stable measuring 7 mm x 15 mm (average 11 mm). Focal nodule within the focus of parenchymal scarring within the right lower lobe, previously seen on axial image # 78, has resolved. 3 mm nodule within the left upper lobe, axial image # 27, is stable. Subpleural ground-glass pulmonary infiltrate with associated interlobular septal thickening at the lung bases bilaterally has progressed slightly since prior examination, likely reflecting progressive fibrotic change. No pneumothorax or pleural effusion. Central airways are widely patent.  Upper Abdomen: Cholecystectomy has been performed. No acute abnormality.  Musculoskeletal: No acute bone abnormality.  IMPRESSION: Stable dominant pulmonary nodule within the right lower lobe. Continued imaging follow-up is recommended in 1 year to document continued stability. This recommendation follows the consensus statement: Guidelines for Management of Incidental Pulmonary Nodules Detected on CT Images: From the Fleischner Society 2017; Radiology 2017; 284:228-243.  Mild interval progression of probable bibasilar pulmonary fibrotic change.  Aortic Atherosclerosis (ICD10-I70.0).   Electronically Signed  By: Fidela Salisbury MD   On: 07/17/2020 04:26       PFT  PFT Results Latest Ref Rng & Units 09/29/2020  FVC-Pre L 2.30  FVC-Predicted Pre % 76  FVC-Post L 2.45   FVC-Predicted Post % 81  Pre FEV1/FVC % % 86  Post FEV1/FCV % % 87  FEV1-Pre L 1.97  FEV1-Predicted Pre % 85  FEV1-Post L 2.15  DLCO uncorrected ml/min/mmHg 18.86  DLCO UNC% % 98  DLCO corrected ml/min/mmHg 18.86  DLCO COR %Predicted % 98  DLVA Predicted % 122  TLC L 3.79  TLC % Predicted % 77  RV % Predicted % 71       has a past medical history of Allergic rhinitis, Allergy, Chest pain (01/22/2019), Dysphagia (01/22/2019), Hyperlipidemia, Hypertension, Osteoarthritis (01/22/2019), and Renal insufficiency (01/22/2019).   reports that she has never smoked. She has never used smokeless tobacco.  Past Surgical History:  Procedure Laterality Date  . ABDOMINAL HYSTERECTOMY    . APPENDECTOMY    . CHOLECYSTECTOMY    . ENDOVENOUS ABLATION SAPHENOUS VEIN W/ LASER Left 10/17/2019   endovenous laser ablation anterior accessory branch of left greater saphenous vein by Gae Gallop MD   . feet surgery    . WRIST SURGERY      Allergies  Allergen Reactions  . Meperidine Shortness Of Breath  . Morphine Shortness Of Breath  . Yellow Dyes (Non-Tartrazine) Anaphylaxis  . Thyroid Swelling  . Furosemide Rash    SWELLING ALLERGY SWELLING ALLERGY   . Nitroglycerin Palpitations  . Penicillin G Rash  . Sulfamethoxazole Rash    Immunization History  Administered Date(s) Administered  . Fluad Quad(high Dose 65+) 05/27/2020  . Influenza,inj,Quad PF,6-35 Mos 05/16/2019  . Moderna SARS-COV2 Booster Vaccination 07/22/2020  . PFIZER(Purple Top)SARS-COV-2 Vaccination 09/09/2019, 09/23/2019, 07/22/2020  . Zoster Recombinat (Shingrix) 05/14/2018    Family History  Problem Relation Age of Onset  . Hyperlipidemia Mother   . Hypertension Mother   . Heart disease Mother   . Heart attack Mother   . Skin cancer Father   . Hyperlipidemia Father   . Hypertension Father   . Heart disease Father   . Heart attack Father   . Hypertension Sister   . Hyperlipidemia Sister   . Hyperlipidemia Brother       Current Outpatient Medications:  .  aspirin EC 81 MG tablet, Take 81 mg by mouth daily., Disp: , Rfl:  .  b complex vitamins capsule, Take 1 capsule by mouth daily., Disp: , Rfl:  .  B Complex-Biotin-FA (SUPER B-50 B COMPLEX) CAPS, Take 1-2 capsules by mouth daily., Disp: , Rfl:  .  Cholecalciferol (VITAMIN D3) 50 MCG (2000 UT) capsule, Take 2,000 Units by mouth daily., Disp: , Rfl:  .  DHEA 10 MG TABS, Take 10 mg by mouth at bedtime., Disp: , Rfl:  .  fluticasone (FLONASE) 50 MCG/ACT nasal spray, USE TWO SPRAYS IN EACH NOSTRIL EVERY DAY, Disp: , Rfl:  .  loratadine (CLARITIN) 10 MG tablet, Take 1 tablet by mouth daily., Disp: , Rfl:  .  meclizine (ANTIVERT) 12.5 MG tablet, Take 12.5 mg by mouth 3 (three) times daily as needed., Disp: , Rfl:  .  Melatonin 10 MG CAPS, Take 10 mg by mouth at bedtime., Disp: , Rfl:  .  mirabegron ER (MYRBETRIQ) 25 MG TB24 tablet, Take 25 mg by mouth daily., Disp: , Rfl:  .  montelukast (SINGULAIR) 10 MG tablet, Take 10 mg by mouth at bedtime., Disp: ,  Rfl:  .  olmesartan (BENICAR) 20 MG tablet, Take 20 mg by mouth daily., Disp: , Rfl:  .  omeprazole (PRILOSEC) 40 MG capsule, Take 40 mg by mouth daily., Disp: , Rfl:  .  simvastatin (ZOCOR) 10 MG tablet, Take 10 mg by mouth daily., Disp: , Rfl:       Objective:   Vitals:   09/29/20 1005  BP: 112/74  Pulse: 61  Temp: (!) 97.4 F (36.3 C)  TempSrc: Oral  SpO2: 98%  Weight: 246 lb (111.6 kg)  Height: 5\' 3"  (1.6 m)    Estimated body mass index is 43.58 kg/m as calculated from the following:   Height as of this encounter: 5\' 3"  (1.6 m).   Weight as of this encounter: 246 lb (111.6 kg).  @WEIGHTCHANGE @  [employer]   09/29/20 1005  Weight: 246 lb (111.6 kg)     Physical Exam   General: No distress. obese Neuro: Alert and Oriented x 3. GCS 15. Speech normal Psych: Pleasant Resp:  Barrel Chest - no.  Wheeze - no, Crackles - no, No overt respiratory distress CVS: Normal heart sounds.  Murmurs - no Ext: Stigmata of Connective Tissue Disease - no HEENT: Normal upper airway. PEERL +. No post nasal drip        Assessment:       ICD-10-CM   1. Solitary pulmonary nodule  R91.1   2. Abnormal CT of the chest  R93.89   3. Personal history of COVID-19  Z86.16   4. History of community acquired pneumonia  Z87.01        Plan:     Patient Instructions  Solitary pulmonary nodule  -15 mm nodule right costophrenic angle stable between December 2020 in December 2021  Plan -Repeat CT scan of the chest without contrast in December 2022 [see below plan]  Abnormal CT of the chest -early interstitial lung abnormalities December 2021 Personal history of COVID-19 History of community acquired pneumonia   -There is scar tissue in the bottom of the lung.  This probably reflects admission for pneumonia 15 years ago and COVID-19 in February 2020 and also repeat COVID-19 in August/September 2021  -At this point in time the not bothering given the not reflected on the breathing test  -There is a small potential this can get worse with time  -Serial monitoring is recommended approach  Plan -Do high-resolution CT scan of the chest supine and prone in 1 year which is December 2022   Follow-up -Return in December 2022 but after high-resolution CT scan of the chest supine and prone     SIGNATURE    Dr. Brand Males, M.D., F.C.C.P,  Pulmonary and Critical Care Medicine Staff Physician, [place] Director - Interstitial Lung Disease  Program  Pulmonary [place] at [place], [place], [address]  Pager: [phone], If no answer or between  15:00h - 7:00h: call [phone] Telephone: [phone]  10:25 AM 09/29/2020

## 2020-09-29 NOTE — Progress Notes (Signed)
PFT done today. 

## 2020-09-29 NOTE — Patient Instructions (Addendum)
Solitary pulmonary nodule  -15 mm nodule right costophrenic angle stable between December 2020 in December 2021  Plan -Repeat CT scan of the chest without contrast in December 2022 [see below plan]  Abnormal CT of the chest -early interstitial lung abnormalities December 2021 Personal history of COVID-19 History of community acquired pneumonia   -There is scar tissue in the bottom of the lung.  This probably reflects admission for pneumonia 15 years ago and COVID-19 in February 2020 and also repeat COVID-19 in August/September 2021  -At this point in time the not bothering given the not reflected on the breathing test  -There is a small potential this can get worse with time  -Serial monitoring is recommended approach  Plan -Do high-resolution CT scan of the chest supine and prone in 1 year which is December 2022   Follow-up -Return in December 2022 but after high-resolution CT scan of the chest supine and prone follow-up for the above issues.  She was

## 2020-10-05 DIAGNOSIS — I10 Essential (primary) hypertension: Secondary | ICD-10-CM | POA: Insufficient documentation

## 2020-10-05 DIAGNOSIS — E785 Hyperlipidemia, unspecified: Secondary | ICD-10-CM | POA: Insufficient documentation

## 2020-10-05 DIAGNOSIS — J309 Allergic rhinitis, unspecified: Secondary | ICD-10-CM | POA: Insufficient documentation

## 2020-10-05 DIAGNOSIS — T7840XA Allergy, unspecified, initial encounter: Secondary | ICD-10-CM | POA: Insufficient documentation

## 2020-10-06 ENCOUNTER — Other Ambulatory Visit: Payer: Self-pay

## 2020-10-06 ENCOUNTER — Ambulatory Visit: Payer: Medicare HMO | Admitting: Cardiology

## 2020-10-06 ENCOUNTER — Encounter: Payer: Self-pay | Admitting: Cardiology

## 2020-10-06 VITALS — BP 126/80 | HR 58 | Ht 62.0 in | Wt 242.2 lb

## 2020-10-06 DIAGNOSIS — E782 Mixed hyperlipidemia: Secondary | ICD-10-CM | POA: Diagnosis not present

## 2020-10-06 DIAGNOSIS — N289 Disorder of kidney and ureter, unspecified: Secondary | ICD-10-CM | POA: Diagnosis not present

## 2020-10-06 DIAGNOSIS — I10 Essential (primary) hypertension: Secondary | ICD-10-CM

## 2020-10-06 NOTE — Progress Notes (Signed)
Cardiology Office Note:    Date:  10/06/2020   ID:  Judy Lucas, DOB 20-May-1956, MRN [id number]  PCP:  Judy Johns, MD  Cardiologist:  Judy Lindau, MD   Referring MD: Judy Johns, MD    ASSESSMENT:    1. Primary hypertension   2. Mixed hyperlipidemia   3. Morbid obesity ([place])   4. Renal insufficiency    PLAN:    In order of problems listed above:  1. Primary prevention stressed with the patient.  Importance of compliance with diet and medication stressed and she vocalized understanding.  I advised her to walk at least half an hour a day every day and she promises to do so. 2. Essential hypertension: Blood pressure stable and diet was emphasized. 3. Mixed dyslipidemia: Lipids were reviewed from [facility] sheet.  She has blood work with her primary care provider. 4. Morbid obesity: Patient has lost weight but mentions to me that she has gained weight in the past few weeks as she had a birthday recently.  I cautioned her against weight gain and I told her the importance of exercise and diet and she promises to follow. 5. Abnormal CT chest: Followed by pulmonary and monitor closely.  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 6. Patient will be seen in follow-up appointment in 6 months or earlier if the patient has any concerns    Medication Adjustments/Labs and Tests Ordered: Current medicines are reviewed at length with the patient today.  Concerns regarding medicines are outlined above.  No orders of the defined types were placed in this encounter.  No orders of the defined types were placed in this encounter.    No chief complaint on file.    History of Present Illness:    Judy Lucas is a 65 y.o. female.  Patient has past medical history of essential hypertension, dyslipidemia and morbid obesity.  She is currently insufficiency.  She has had an abnormal CT of the chest followed by pulmonary edema very close fashion.  She denies any chest pain  orthopnea or PND.  At the time of my evaluation, the patient is alert awake oriented and in no distress.  Past Medical History:  Diagnosis Date  . Abnormal CT of the chest 08/27/2019  . Allergic rhinitis   . Allergy   . Chest discomfort 02/20/2019  . Chest pain 01/22/2019  . Dysphagia 01/22/2019  . Hyperlipidemia   . Hypertension   . Morbid obesity ([place]) 01/22/2019  . Osteoarthritis 01/22/2019  . Renal insufficiency 01/22/2019    Past Surgical History:  Procedure Laterality Date  . ABDOMINAL HYSTERECTOMY    . APPENDECTOMY    . CHOLECYSTECTOMY    . ENDOVENOUS ABLATION SAPHENOUS VEIN W/ LASER Left 10/17/2019   endovenous laser ablation anterior accessory branch of left greater saphenous vein by Gae Gallop MD   . feet surgery    . WRIST SURGERY      Current Medications: Current Meds  Medication Sig  . aspirin EC 81 MG tablet Take 81 mg by mouth daily.  . B Complex-Biotin-FA (SUPER B-50 B COMPLEX) CAPS Take 1-2 capsules by mouth daily.  . Cholecalciferol (VITAMIN D3) 50 MCG (2000 UT) capsule Take 2,000 Units by mouth daily.  Marland Kitchen DHEA 10 MG TABS Take 10 mg by mouth at bedtime.  . fluticasone (FLONASE) 50 MCG/ACT nasal spray Place 2 sprays into both nostrils daily.  Marland Kitchen loratadine (CLARITIN) 10 MG tablet Take 1 tablet by mouth daily.  . meclizine (ANTIVERT)  12.5 MG tablet Take 12.5 mg by mouth 3 (three) times daily as needed for dizziness.  . Melatonin 10 MG CAPS Take 10 mg by mouth at bedtime.  . mirabegron ER (MYRBETRIQ) 25 MG TB24 tablet Take 25 mg by mouth daily.  . montelukast (SINGULAIR) 10 MG tablet Take 10 mg by mouth at bedtime.  Marland Kitchen olmesartan (BENICAR) 20 MG tablet Take 20 mg by mouth daily.  Marland Kitchen omeprazole (PRILOSEC) 40 MG capsule Take 40 mg by mouth daily.  . simvastatin (ZOCOR) 10 MG tablet Take 10 mg by mouth daily.     Allergies:   Meperidine, Morphine, Yellow dyes (non-tartrazine), Thyroid, Furosemide, Nitroglycerin, Penicillin g, and Sulfamethoxazole   Social History    Socioeconomic History  . Marital status: Married    Spouse name: Not on file  . Number of children: Not on file  . Years of education: Not on file  . Highest education level: Not on file  Occupational History  . Not on file  Tobacco Use  . Smoking status: Never Smoker  . Smokeless tobacco: Never Used  Vaping Use  . Vaping Use: Never used  Substance and Sexual Activity  . Alcohol use: Never  . Drug use: Never  . Sexual activity: Not on file  Other Topics Concern  . Not on file  Social History Narrative  . Not on file   Social Determinants of Health   Financial Resource Strain: Not on file  Food Insecurity: Not on file  Transportation Needs: Not on file  Physical Activity: Not on file  Stress: Not on file  Social Connections: Not on file     Family History: The patient's family history includes Heart attack in her father and mother; Heart disease in her father and mother; Hyperlipidemia in her brother, father, mother, and sister; Hypertension in her father, mother, and sister; Skin cancer in her father.  ROS:   Please see the history of present illness.    All other systems reviewed and are negative.  EKGs/Labs/Other Studies Reviewed:    The following studies were reviewed today: I discussed my findings with the patient at length.   Recent Labs: No results found for requested labs within last 8760 hours.  Recent Lipid Panel No results found for: CHOL, TRIG, HDL, CHOLHDL, VLDL, LDLCALC, LDLDIRECT  Physical Exam:    VS:  BP 126/80   Pulse (!) 58   Ht 5\' 2"  (1.575 m)   Wt 242 lb 3.2 oz (109.9 kg)   SpO2 96%   BMI 44.30 kg/m     Wt Readings from Last 3 Encounters:  10/06/20 242 lb 3.2 oz (109.9 kg)  09/29/20 246 lb (111.6 kg)  07/27/20 247 lb 3.2 oz (112.1 kg)     GEN: Patient is in no acute distress HEENT: Normal NECK: No JVD; No carotid bruits LYMPHATICS: No lymphadenopathy CARDIAC: Hear sounds regular, 2/6 systolic murmur at the  apex. RESPIRATORY:  Clear to auscultation without rales, wheezing or rhonchi  ABDOMEN: Soft, non-tender, non-distended MUSCULOSKELETAL:  No edema; No deformity  SKIN: Warm and dry NEUROLOGIC:  Alert and oriented x 3 PSYCHIATRIC:  Normal affect   Signed, Judy Lindau, MD  10/06/2020 9:46 AM    [facility]

## 2020-10-06 NOTE — Patient Instructions (Signed)

## 2020-11-03 DIAGNOSIS — Z1231 Encounter for screening mammogram for malignant neoplasm of breast: Secondary | ICD-10-CM | POA: Diagnosis not present

## 2020-11-16 DIAGNOSIS — L821 Other seborrheic keratosis: Secondary | ICD-10-CM | POA: Diagnosis not present

## 2020-11-16 DIAGNOSIS — D485 Neoplasm of uncertain behavior of skin: Secondary | ICD-10-CM | POA: Diagnosis not present

## 2020-11-16 DIAGNOSIS — L578 Other skin changes due to chronic exposure to nonionizing radiation: Secondary | ICD-10-CM | POA: Diagnosis not present

## 2020-12-31 DIAGNOSIS — E785 Hyperlipidemia, unspecified: Secondary | ICD-10-CM | POA: Diagnosis not present

## 2020-12-31 DIAGNOSIS — Z8739 Personal history of other diseases of the musculoskeletal system and connective tissue: Secondary | ICD-10-CM | POA: Diagnosis not present

## 2020-12-31 DIAGNOSIS — N289 Disorder of kidney and ureter, unspecified: Secondary | ICD-10-CM | POA: Diagnosis not present

## 2020-12-31 DIAGNOSIS — Z79899 Other long term (current) drug therapy: Secondary | ICD-10-CM | POA: Diagnosis not present

## 2020-12-31 DIAGNOSIS — E559 Vitamin D deficiency, unspecified: Secondary | ICD-10-CM | POA: Diagnosis not present

## 2020-12-31 DIAGNOSIS — R600 Localized edema: Secondary | ICD-10-CM | POA: Diagnosis not present

## 2020-12-31 DIAGNOSIS — I10 Essential (primary) hypertension: Secondary | ICD-10-CM | POA: Diagnosis not present

## 2020-12-31 DIAGNOSIS — E119 Type 2 diabetes mellitus without complications: Secondary | ICD-10-CM | POA: Diagnosis not present

## 2021-01-13 DIAGNOSIS — M19072 Primary osteoarthritis, left ankle and foot: Secondary | ICD-10-CM | POA: Diagnosis not present

## 2021-01-13 DIAGNOSIS — M19071 Primary osteoarthritis, right ankle and foot: Secondary | ICD-10-CM | POA: Diagnosis not present

## 2021-02-13 DIAGNOSIS — R300 Dysuria: Secondary | ICD-10-CM | POA: Diagnosis not present

## 2021-02-13 DIAGNOSIS — N390 Urinary tract infection, site not specified: Secondary | ICD-10-CM | POA: Diagnosis not present

## 2021-02-24 DIAGNOSIS — H903 Sensorineural hearing loss, bilateral: Secondary | ICD-10-CM

## 2021-02-24 DIAGNOSIS — R1314 Dysphagia, pharyngoesophageal phase: Secondary | ICD-10-CM | POA: Diagnosis not present

## 2021-02-24 HISTORY — DX: Sensorineural hearing loss, bilateral: H90.3

## 2021-03-30 ENCOUNTER — Ambulatory Visit: Payer: Medicare HMO | Admitting: Cardiology

## 2021-03-30 ENCOUNTER — Other Ambulatory Visit: Payer: Self-pay

## 2021-03-30 ENCOUNTER — Encounter: Payer: Self-pay | Admitting: Cardiology

## 2021-03-30 VITALS — BP 102/62 | HR 55 | Ht 62.5 in | Wt 240.0 lb

## 2021-03-30 DIAGNOSIS — I10 Essential (primary) hypertension: Secondary | ICD-10-CM | POA: Diagnosis not present

## 2021-03-30 DIAGNOSIS — E782 Mixed hyperlipidemia: Secondary | ICD-10-CM

## 2021-03-30 NOTE — Patient Instructions (Signed)
Medication Instructions:  Your physician has recommended you make the following change in your medication:   Decrease Benicar (Olmesartan) to 10 mg daily.  *If you need a refill on your cardiac medications before your next appointment, please call your pharmacy*   Lab Work: None ordered If you have labs (blood work) drawn today and your tests are completely normal, you will receive your results only by: [place] (if you have MyChart) OR A paper copy in the mail If you have any lab test that is abnormal or we need to change your treatment, we will call you to review the results.   Testing/Procedures: None ordered   Follow-Up: At [facility], you and your health needs are our priority.  As part of our continuing mission to provide you with exceptional heart care, we have created designated Provider Care Teams.  These Care Teams include your primary Cardiologist (physician) and Advanced Practice Providers (APPs -  Physician Assistants and Nurse Practitioners) who all work together to provide you with the care you need, when you need it.  We recommend signing up for the patient portal called "MyChart".  Sign up information is provided on this After Visit Summary.  MyChart is used to connect with patients for Virtual Visits (Telemedicine).  Patients are able to view lab/test results, encounter notes, upcoming appointments, etc.  Non-urgent messages can be sent to your provider as well.   To learn more about what you can do with MyChart, go to [email or web address].    Your next appointment:   6 month(s)  The format for your next appointment:   In Person  Provider:   Jyl Heinz, MD   Other Instructions NA

## 2021-03-30 NOTE — Progress Notes (Signed)
Cardiology Office Note:    Date:  03/30/2021   ID:  Judy Lucas, DOB [birth year], MRN [id number]  PCP:  Judy Johns, MD  Cardiologist:  Judy Lindau, MD   Referring MD: Judy Johns, MD    ASSESSMENT:    1. Primary hypertension   2. Mixed hyperlipidemia   3. Morbid obesity ([place])    PLAN:    In order of problems listed above:  Primary prevention stressed with the patient.  Importance of compliance with diet medication stressed and she vocalized understanding.  I told her to walk at least half an hour a day on a regular basis and she promises to do so. Essential hypertension: Blood pressure stable.  Matter-of-fact I think her jittery symptoms may be related to low blood pressure.  Her primary has already cut down Benicar to 20 a day we will cut it down to further 10 mg a day.  Adequate hydration was advised.  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 Mixed dyslipidemia: Lipids were reviewed and they were done recently and they are fine and I discussed this with her at length. Morbid obesity: Weight reduction was stressed risks of obesity explained and she promises to do better. Patient will be seen in follow-up appointment in 6 months or earlier if the patient has any concerns    Medication Adjustments/Labs and Tests Ordered: Current medicines are reviewed at length with the patient today.  Concerns regarding medicines are outlined above.  No orders of the defined types were placed in this encounter.  No orders of the defined types were placed in this encounter.    Chief Complaint  Patient presents with   Follow-up    Skip heart beats continue, chest pain due to enlarge lymph node     History of Present Illness:    Judy Lucas is a 65 y.o. female.  Patient has past medical history of essential hypertension dyslipidemia and morbid obesity.  She denies any problems at this time and takes care of activities of daily living.  No chest  pain orthopnea or PND.  She mentions to me that occasionally.  She feels a little jittery and then when she hydrates herself and feels better.  At the time of my evaluation, the patient is alert awake oriented and in no distress.  Her abnormal CT scan has been followed by pulmonary doctors.  Past Medical History:  Diagnosis Date   Abnormal CT of the chest 08/27/2019   Allergic rhinitis    Allergy    Chest discomfort 02/20/2019   Chest pain 01/22/2019   Dysphagia 01/22/2019   Hyperlipidemia    Hypertension    Morbid obesity ([place]) 01/22/2019   Osteoarthritis 01/22/2019   Renal insufficiency 01/22/2019    Past Surgical History:  Procedure Laterality Date   ABDOMINAL HYSTERECTOMY     APPENDECTOMY     CHOLECYSTECTOMY     ENDOVENOUS ABLATION SAPHENOUS VEIN W/ LASER Left 10/17/2019   endovenous laser ablation anterior accessory branch of left greater saphenous vein by Gae Gallop MD    feet surgery     WRIST SURGERY      Current Medications: Current Meds  Medication Sig   aspirin EC 81 MG tablet Take 81 mg by mouth daily.   B Complex-Biotin-FA (SUPER B-50 B COMPLEX) CAPS Take 1-2 capsules by mouth daily. Unknown strength   Cholecalciferol (VITAMIN D3) 50 MCG (2000 UT) capsule Take 2,000 Units by mouth daily.  fluticasone (FLONASE) 50 MCG/ACT nasal spray Place 2 sprays into both nostrils daily.   loratadine (CLARITIN) 10 MG tablet Take 1 tablet by mouth daily.   meclizine (ANTIVERT) 12.5 MG tablet Take 12.5 mg by mouth 3 (three) times daily as needed for dizziness.   Melatonin 10 MG CAPS Take 10 mg by mouth at bedtime.   mirabegron ER (MYRBETRIQ) 25 MG TB24 tablet Take 25 mg by mouth daily.   montelukast (SINGULAIR) 10 MG tablet Take 10 mg by mouth at bedtime.   olmesartan (BENICAR) 20 MG tablet Take 20 mg by mouth daily.   omeprazole (PRILOSEC) 40 MG capsule Take 20 mg by mouth 2 (two) times daily.   simvastatin (ZOCOR) 10 MG tablet Take 10 mg by mouth daily.     Allergies:   Meperidine,  Morphine, Yellow dyes (non-tartrazine), Thyroid, Furosemide, Nitrofurantoin, Nitroglycerin, Penicillin g, and Sulfamethoxazole   Social History   Socioeconomic History   Marital status: Married    Spouse name: Not on file   Number of children: Not on file   Years of education: Not on file   Highest education level: Not on file  Occupational History   Not on file  Tobacco Use   Smoking status: Never   Smokeless tobacco: Never  Vaping Use   Vaping Use: Never used  Substance and Sexual Activity   Alcohol use: Never   Drug use: Never   Sexual activity: Not on file  Other Topics Concern   Not on file  Social History Narrative   Not on file   Social Determinants of Health   Financial Resource Strain: Not on file  Food Insecurity: Not on file  Transportation Needs: Not on file  Physical Activity: Not on file  Stress: Not on file  Social Connections: Not on file     Family History: The patient's family history includes Heart attack in her father and mother; Heart disease in her father and mother; Hyperlipidemia in her brother, father, mother, and sister; Hypertension in her father, mother, and sister; Skin cancer in her father.  ROS:   Please see the history of present illness.    All other systems reviewed and are negative.  EKGs/Labs/Other Studies Reviewed:    The following studies were reviewed today: I discussed my findings with the patient at length.  EKG reveals sinus rhythm and nonspecific ST-T changes   Recent Labs: No results found for requested labs within last 8760 hours.  Recent Lipid Panel No results found for: CHOL, TRIG, HDL, CHOLHDL, VLDL, LDLCALC, LDLDIRECT  Physical Exam:    VS:  BP 102/62 (BP Location: Right Arm, Patient Position: Sitting)   Pulse (!) 55   Ht 5' 2.5" (1.588 m)   Wt 240 lb (108.9 kg)   SpO2 96%   BMI 43.20 kg/m     Wt Readings from Last 3 Encounters:  03/30/21 240 lb (108.9 kg)  10/06/20 242 lb 3.2 oz (109.9 kg)  09/29/20  246 lb (111.6 kg)     GEN: Patient is in no acute distress HEENT: Normal NECK: No JVD; No carotid bruits LYMPHATICS: No lymphadenopathy CARDIAC: Hear sounds regular, 2/6 systolic murmur at the apex. RESPIRATORY:  Clear to auscultation without rales, wheezing or rhonchi  ABDOMEN: Soft, non-tender, non-distended MUSCULOSKELETAL:  No edema; No deformity  SKIN: Warm and dry NEUROLOGIC:  Alert and oriented x 3 PSYCHIATRIC:  Normal affect   Signed, Judy Lindau, MD  03/30/2021 8:36 AM    [place]

## 2021-04-08 DIAGNOSIS — R600 Localized edema: Secondary | ICD-10-CM | POA: Diagnosis not present

## 2021-04-08 DIAGNOSIS — E559 Vitamin D deficiency, unspecified: Secondary | ICD-10-CM | POA: Diagnosis not present

## 2021-04-08 DIAGNOSIS — N289 Disorder of kidney and ureter, unspecified: Secondary | ICD-10-CM | POA: Diagnosis not present

## 2021-04-08 DIAGNOSIS — E785 Hyperlipidemia, unspecified: Secondary | ICD-10-CM | POA: Diagnosis not present

## 2021-04-08 DIAGNOSIS — Z79899 Other long term (current) drug therapy: Secondary | ICD-10-CM | POA: Diagnosis not present

## 2021-04-08 DIAGNOSIS — I700 Atherosclerosis of aorta: Secondary | ICD-10-CM | POA: Diagnosis not present

## 2021-04-08 DIAGNOSIS — E119 Type 2 diabetes mellitus without complications: Secondary | ICD-10-CM | POA: Diagnosis not present

## 2021-04-08 DIAGNOSIS — I10 Essential (primary) hypertension: Secondary | ICD-10-CM | POA: Diagnosis not present

## 2021-04-21 DIAGNOSIS — B028 Zoster with other complications: Secondary | ICD-10-CM | POA: Diagnosis not present

## 2021-04-21 DIAGNOSIS — L308 Other specified dermatitis: Secondary | ICD-10-CM | POA: Diagnosis not present

## 2021-05-19 DIAGNOSIS — M19071 Primary osteoarthritis, right ankle and foot: Secondary | ICD-10-CM | POA: Diagnosis not present

## 2021-05-19 DIAGNOSIS — M19072 Primary osteoarthritis, left ankle and foot: Secondary | ICD-10-CM | POA: Diagnosis not present

## 2021-06-30 DIAGNOSIS — E669 Obesity, unspecified: Secondary | ICD-10-CM | POA: Diagnosis not present

## 2021-06-30 DIAGNOSIS — Z9181 History of falling: Secondary | ICD-10-CM | POA: Diagnosis not present

## 2021-06-30 DIAGNOSIS — Z139 Encounter for screening, unspecified: Secondary | ICD-10-CM | POA: Diagnosis not present

## 2021-06-30 DIAGNOSIS — E785 Hyperlipidemia, unspecified: Secondary | ICD-10-CM | POA: Diagnosis not present

## 2021-06-30 DIAGNOSIS — Z1331 Encounter for screening for depression: Secondary | ICD-10-CM | POA: Diagnosis not present

## 2021-06-30 DIAGNOSIS — Z6841 Body Mass Index (BMI) 40.0 and over, adult: Secondary | ICD-10-CM | POA: Diagnosis not present

## 2021-06-30 DIAGNOSIS — Z0000 Encounter for general adult medical examination without abnormal findings: Secondary | ICD-10-CM | POA: Diagnosis not present

## 2021-07-13 DIAGNOSIS — R600 Localized edema: Secondary | ICD-10-CM | POA: Diagnosis not present

## 2021-07-13 DIAGNOSIS — J309 Allergic rhinitis, unspecified: Secondary | ICD-10-CM | POA: Diagnosis not present

## 2021-07-13 DIAGNOSIS — E559 Vitamin D deficiency, unspecified: Secondary | ICD-10-CM | POA: Diagnosis not present

## 2021-07-13 DIAGNOSIS — I10 Essential (primary) hypertension: Secondary | ICD-10-CM | POA: Diagnosis not present

## 2021-07-13 DIAGNOSIS — N289 Disorder of kidney and ureter, unspecified: Secondary | ICD-10-CM | POA: Diagnosis not present

## 2021-07-13 DIAGNOSIS — E119 Type 2 diabetes mellitus without complications: Secondary | ICD-10-CM | POA: Diagnosis not present

## 2021-07-13 DIAGNOSIS — Z79899 Other long term (current) drug therapy: Secondary | ICD-10-CM | POA: Diagnosis not present

## 2021-07-13 DIAGNOSIS — E1151 Type 2 diabetes mellitus with diabetic peripheral angiopathy without gangrene: Secondary | ICD-10-CM | POA: Diagnosis not present

## 2021-07-13 DIAGNOSIS — Z8739 Personal history of other diseases of the musculoskeletal system and connective tissue: Secondary | ICD-10-CM | POA: Diagnosis not present

## 2021-07-13 DIAGNOSIS — E785 Hyperlipidemia, unspecified: Secondary | ICD-10-CM | POA: Diagnosis not present

## 2021-07-27 ENCOUNTER — Ambulatory Visit (INDEPENDENT_AMBULATORY_CARE_PROVIDER_SITE_OTHER)
Admission: RE | Admit: 2021-07-27 | Discharge: 2021-07-27 | Disposition: A | Discharge status: Home / Self Care | Payer: Medicare HMO | Source: Ambulatory Visit | Attending: Internal Medicine | Admitting: Internal Medicine

## 2021-07-27 ENCOUNTER — Other Ambulatory Visit: Payer: Self-pay

## 2021-07-27 DIAGNOSIS — R9389 Abnormal findings on diagnostic imaging of other specified body structures: Secondary | ICD-10-CM

## 2021-07-27 DIAGNOSIS — R911 Solitary pulmonary nodule: Secondary | ICD-10-CM

## 2021-07-27 DIAGNOSIS — I517 Cardiomegaly: Secondary | ICD-10-CM | POA: Diagnosis not present

## 2021-07-27 DIAGNOSIS — J941 Fibrothorax: Secondary | ICD-10-CM | POA: Diagnosis not present

## 2021-07-27 DIAGNOSIS — J84112 Idiopathic pulmonary fibrosis: Secondary | ICD-10-CM | POA: Diagnosis not present

## 2021-07-27 DIAGNOSIS — I700 Atherosclerosis of aorta: Secondary | ICD-10-CM | POA: Diagnosis not present

## 2021-07-30 ENCOUNTER — Other Ambulatory Visit: Payer: Self-pay

## 2021-07-30 ENCOUNTER — Encounter: Payer: Self-pay | Admitting: Internal Medicine

## 2021-07-30 ENCOUNTER — Ambulatory Visit: Payer: Medicare HMO | Admitting: Internal Medicine

## 2021-07-30 VITALS — BP 124/68 | HR 52 | Temp 97.6°F | Ht 62.5 in | Wt 237.0 lb

## 2021-07-30 DIAGNOSIS — Z8701 Personal history of pneumonia (recurrent): Secondary | ICD-10-CM | POA: Diagnosis not present

## 2021-07-30 DIAGNOSIS — Z8616 Personal history of COVID-19: Secondary | ICD-10-CM

## 2021-07-30 DIAGNOSIS — R911 Solitary pulmonary nodule: Secondary | ICD-10-CM

## 2021-07-30 DIAGNOSIS — R9389 Abnormal findings on diagnostic imaging of other specified body structures: Secondary | ICD-10-CM | POA: Diagnosis not present

## 2021-07-30 LAB — SEDIMENTATION RATE: Sed Rate: 13 mm/hr (ref 0–30)

## 2021-07-30 NOTE — Patient Instructions (Addendum)
Solitary pulmonary nodule ° °-15 mm nodule right costophrenic angle stable between December 2020 in December 2021. NO change in Dec 2022 CT ° °Plan °- expectant followup ° °Abnormal CT of the chest -early interstitial lung abnormalities December 2021 °Personal history of COVID-19 °History of community acquired pneumonia ° ° °-There is scar tissue in the bottom of the lung.  This probably reflects admission for pneumonia 15 years ago and COVID-19 in February 2020 and also repeat COVID-19 in August/September 2021 ° ° °-At this point in time the not bothering you . However, radiologist feels worse since 2016 but stable 20210> 2022. Concern is you have ILD  ° °- I think we need strategy around this ° ° ° °Plan °-Do ILD questionnaire and bring it back with you next vist ° ° -Do  Serum: ESR, ACE, ANA, DS-DNA, RF, anti-CCP,  ANCA screen, MPO, PR-3, Total CK,  scl-70, ssA, ssB, anti-RNP,  & Hypersensitivity Pneumonitis Panel and Quantiferon Gold - do 07/30/2021 ° ° °- Do full PFT  next few months any time ° ° °Follow-up °- return in Jan 2023/feb 2023 for 30 min visit buta fter completing above ° - simple walk test at followup ° - can discuss monitoring v biopsy approach at followup °

## 2021-07-30 NOTE — Progress Notes (Signed)
OV 09/29/2020  Subjective:  Patient ID: Judy Lucas, female , DOB: February 13, 1956 , age 65 y.o. , MRN: [id number] , ADDRESS: [place] [place] [address] PCP Nicholos Johns, MD Patient Care Team: Nicholos Johns, MD as PCP - General (Internal Medicine)  This Provider for this visit: Treatment Team:  Attending Provider: Brand Males, MD    09/29/2020 -   Chief Complaint  Patient presents with   Follow-up    Doing well  .   ICD-10-CM   1. Solitary pulmonary nodule  R91.1   2. Abnormal CT of the chest  R93.89   3. Personal history of COVID-19  Z86.16   4. History of community acquired pneumonia  Z87.01      HPI Judy Lucas 65 y.o. -follow-up for the above issues.  She was a patient of Dr. Carlis Abbott.  Dr. Carlis Abbott is now in the ICU rotation service.  Patient is being transferred to Dr. Chase Caller.  She is solitary pulmonary nodule 15 mm in the right costophrenic angle.  She had follow-up 1 year scan in December 2021 and it is stable.  She has early interstitial lung abnormalities in the lung bases.  According to the radiologist it is worse.  According to the patient she is asymptomatic without any shortness of breath or cough wheezing orthopnea proximal nocturnal dyspnea.  She denies any acid reflux.  She denies any tobacco smoking.  She is a retired Sports coach and former Marine scientist as well.  There is no mold or mildew in the house.  No feather pillow.  She has a maple tree allergy that seasonal but otherwise she is fine.  She gives a history of community-acquired pneumonia and hospitalization 15 years ago.  Also COVID-19 in February 2020 and repeat in August/September 2021 for which she was not hospitalized.  She believes the scar tissue might be related to that.    CT Chest data  Narrative & Impression  CLINICAL DATA:  Pulmonary nodule, follow-up examination   EXAM: CT CHEST WITHOUT CONTRAST   TECHNIQUE: Multidetector CT imaging of the chest was performed following  the standard protocol without IV contrast.   COMPARISON:  07/19/2019, 06/18/2015   FINDINGS: Cardiovascular: No significant coronary artery calcification. Global cardiac size within normal limits. No pericardial effusion. Central pulmonary arteries are of normal caliber. Mild atherosclerotic calcifications seen within the thoracic aorta. No aortic aneurysm.   Mediastinum/Nodes: Visualized thyroid unremarkable. No pathologic thoracic adenopathy. Esophagus unremarkable.   Lungs/Pleura: Ovoid nodule seen within the right costophrenic angle at axial image # 113 is stable measuring 7 mm x 15 mm (average 11 mm). Focal nodule within the focus of parenchymal scarring within the right lower lobe, previously seen on axial image # 78, has resolved. 3 mm nodule within the left upper lobe, axial image # 27, is stable. Subpleural ground-glass pulmonary infiltrate with associated interlobular septal thickening at the lung bases bilaterally has progressed slightly since prior examination, likely reflecting progressive fibrotic change. No pneumothorax or pleural effusion. Central airways are widely patent.   Upper Abdomen: Cholecystectomy has been performed. No acute abnormality.   Musculoskeletal: No acute bone abnormality.   IMPRESSION: Stable dominant pulmonary nodule within the right lower lobe. Continued imaging follow-up is recommended in 1 year to document continued stability. This recommendation follows the consensus statement: Guidelines for Management of Incidental Pulmonary Nodules Detected on CT Images: From the Fleischner Society 2017; Radiology 2017; 284:228-243.   Mild interval progression of probable bibasilar pulmonary fibrotic change.  Aortic Atherosclerosis (ICD10-I70.0).     Electronically Signed   By: Fidela Salisbury MD   On: 07/17/2020 04:26          OV 07/30/2021  Subjective:  Patient ID: Judy Lucas, female , DOB: 1956-03-19 , age 42 y.o. , MRN:  [id number] , ADDRESS: [place] [place] [address] PCP Nicholos Johns, MD Patient Care Team: Nicholos Johns, MD as PCP - General (Internal Medicine)  This Provider for this visit: Treatment Team:  Attending Provider: Brand Males, MD    07/30/2021 -   Chief Complaint  Patient presents with   Follow-up    Pt is here to discuss results of recent CT.  Pt states she has been doing okay since last visit and denies any complaints.   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 -last seen earlier this year.  Since then stable no interim COVID.  No interim issues.  Early morning she has occasional dry cough from what she thinks is allergies.  No shortness of breath with exertion.  Sometimes she feels muscle pull on the back of her chest but otherwise no issues.  This is baseline and chronic.  She had high-resolution CT scan of the chest that shows right lower lobe nodule is stable and radiologist does deem this is benign.  However they are concerned about ILD changes.  I did visualize this and agree with them.  They are saying it is worst in 2016 but stable in the last 1 year.  I did share this with the patient.  Did explain to her that some of the ILD's could get worse.  She is interested in getting a work-up done and following physician recommendation on this.  She is somewhat reassured that the ILD is deemed stable in the last 1 year on CT scan.  Of note her husband is also here with her today.    CT Chest data  CT Chest High Resolution  Result Date: 07/28/2021 CLINICAL DATA:  Pulmonary nodule, possible pulmonary fibrosis, evaluate for progression EXAM: CT CHEST WITHOUT CONTRAST TECHNIQUE: Multidetector CT imaging of the chest was performed  following the standard protocol without intravenous contrast. High resolution imaging of the lungs, as well as inspiratory and expiratory imaging, was performed. COMPARISON:  07/16/2020, 07/19/2019, 04/25/2019, 06/18/2015 FINDINGS: Cardiovascular: Scattered aortic atherosclerosis. Cardiomegaly. No pericardial effusion. Mediastinum/Nodes: No enlarged mediastinal, hilar, or axillary lymph nodes. Thyroid gland, trachea, and esophagus demonstrate no significant findings. Lungs/Pleura: Mild pulmonary fibrosis in a pattern with apical to basal gradient, featuring irregular peripheral interstitial opacity and septal thickening without evidence of bronchiolectasis or honeycombing. Fibrosis is not significantly changed compared to immediate prior examination. Mild lobular air trapping on expiratory phase imaging. Stable, definitively benign nodule of the right lung base measuring 1.4 x 0.9 cm (series 5, image 230). No pleural effusion or pneumothorax. Upper Abdomen: No acute abnormality.  Status post cholecystectomy. Musculoskeletal: No chest wall mass or suspicious bone lesions identified. IMPRESSION: 1. Mild pulmonary fibrosis in a pattern with apical to basal gradient, featuring irregular peripheral interstitial opacity and septal thickening without evidence of bronchiolectasis or honeycombing. Fibrotic findings are not significantly changed compared to immediate prior examination although clearly worsened over time dating back to 06/18/2015. Strictly characterized, findings are (early) indeterminate for UIP per consensus guidelines, although  progression over time is concerning for UIP: Diagnosis of Idiopathic Pulmonary Fibrosis: An Official ATS/ERS/JRS/ALAT Clinical Practice Guideline. [place], Iss 5, [phone], Apr 15 2017. 2. Stable, definitively benign nodule of the right lung base. No further follow-up is specifically required for this benign nodule. 3. Cardiomegaly. Aortic Atherosclerosis  (ICD10-I70.0). Electronically Signed   By: Delanna Ahmadi M.D.   On: 07/28/2021 14:49      PFT  PFT Results Latest Ref Rng & Units 09/29/2020  FVC-Pre L 2.30  FVC-Predicted Pre % 76  FVC-Post L 2.45  FVC-Predicted Post % 81  Pre FEV1/FVC % % 86  Post FEV1/FCV % % 87  FEV1-Pre L 1.97  FEV1-Predicted Pre % 85  FEV1-Post L 2.15  DLCO uncorrected ml/min/mmHg 18.86  DLCO UNC% % 98  DLCO corrected ml/min/mmHg 18.86  DLCO COR %Predicted % 98  DLVA Predicted % 122  TLC L 3.79  TLC % Predicted % 77  RV % Predicted % 71       has a past medical history of Abnormal CT of the chest (08/27/2019), Allergic rhinitis, Allergy, Chest discomfort (02/20/2019), Chest pain (01/22/2019), Dysphagia (01/22/2019), Hyperlipidemia, Hypertension, Morbid obesity ([place]) (01/22/2019), Osteoarthritis (01/22/2019), and Renal insufficiency (01/22/2019).   reports that she has never smoked. She has never used smokeless tobacco.  Past Surgical History:  Procedure Laterality Date   ABDOMINAL HYSTERECTOMY     APPENDECTOMY     CHOLECYSTECTOMY     ENDOVENOUS ABLATION SAPHENOUS VEIN W/ LASER Left 10/17/2019   endovenous laser ablation anterior accessory branch of left greater saphenous vein by Gae Gallop MD    feet surgery     WRIST SURGERY      Allergies  Allergen Reactions   Meperidine Shortness Of Breath   Morphine Shortness Of Breath   Yellow Dyes (Non-Tartrazine) Anaphylaxis   Thyroid Swelling   Furosemide Rash    SWELLING ALLERGY SWELLING ALLERGY    Nitrofurantoin Rash   Nitroglycerin Palpitations   Penicillin G Rash   Sulfamethoxazole Rash    Immunization History  Administered Date(s) Administered   Fluad Quad(high Dose 65+) 05/27/2020   Influenza, High Dose Seasonal PF 05/27/2021   Influenza,inj,Quad PF,6-35 Mos 05/16/2019   PFIZER(Purple Top)SARS-COV-2 Vaccination 09/09/2019, 09/23/2019, 07/22/2020   Zoster Recombinat (Shingrix) 05/14/2018    Family History  Problem Relation Age of Onset    Hyperlipidemia Mother    Hypertension Mother    Heart disease Mother    Heart attack Mother    Skin cancer Father    Hyperlipidemia Father    Hypertension Father    Heart disease Father    Heart attack Father    Hypertension Sister    Hyperlipidemia Sister    Hyperlipidemia Brother      Current Outpatient Medications:    aspirin EC 81 MG tablet, Take 81 mg by mouth daily., Disp: , Rfl:    B Complex-Biotin-FA (SUPER B-50 B COMPLEX) CAPS, Take 1-2 capsules by mouth daily. Unknown strength, Disp: , Rfl:    Cholecalciferol (VITAMIN D3) 50 MCG (2000 UT) capsule, Take 2,000 Units by mouth daily., Disp: , Rfl:    fluticasone (FLONASE) 50 MCG/ACT nasal spray, Place 2 sprays into both nostrils daily., Disp: , Rfl:    loratadine (CLARITIN) 10 MG tablet, Take 1 tablet by mouth daily., Disp: , Rfl:    meclizine (ANTIVERT) 12.5 MG tablet, Take 12.5 mg by mouth 3 (three) times daily as needed for dizziness., Disp: , Rfl:    mirabegron ER (  MYRBETRIQ) 25 MG TB24 tablet, Take 25 mg by mouth daily., Disp: , Rfl:    montelukast (SINGULAIR) 10 MG tablet, Take 10 mg by mouth at bedtime., Disp: , Rfl:    olmesartan (BENICAR) 20 MG tablet, Take 10 mg by mouth daily., Disp: , Rfl:    omeprazole (PRILOSEC) 40 MG capsule, Take 20 mg by mouth 2 (two) times daily., Disp: , Rfl:    simvastatin (ZOCOR) 10 MG tablet, Take 10 mg by mouth daily., Disp: , Rfl:       Objective:   Vitals:   07/30/21 1028  BP: 124/68  Pulse: (!) 52  Temp: 97.6 F (36.4 C)  TempSrc: Oral  SpO2: 99%  Weight: 237 lb (107.5 kg)  Height: 5' 2.5" (1.588 m)    Estimated body mass index is 42.66 kg/m as calculated from the following:   Height as of this encounter: 5' 2.5" (1.588 m).   Weight as of this encounter: 237 lb (107.5 kg).  @WEIGHTCHANGE @  Filed Weights   07/30/21 1028  Weight: 237 lb (107.5 kg)     Physical Exam    General: No distress. obese Neuro: Alert and Oriented x 3. GCS 15. Speech normal Psych:  Pleasant Resp:  Barrel Chest - no.  Wheeze - no, Crackles - no, No overt respiratory distress CVS: Normal heart sounds. Murmurs - no Ext: Stigmata of Connective Tissue Disease - no HEENT: Normal upper airway. PEERL +. No post nasal drip        Assessment:       ICD-10-CM   1. Solitary pulmonary nodule  R91.1     2. Abnormal CT of the chest  R93.89     3. Personal history of COVID-19  Z86.16     4. History of community acquired pneumonia  Z87.01          Plan:     Patient Instructions  Solitary pulmonary nodule  -15 mm nodule right costophrenic angle stable between December 2020 in December 2021. NO change in Dec 2022 CT  Plan - expectant followup  Abnormal CT of the chest -early interstitial lung abnormalities December 2021 Personal history of COVID-19 History of community acquired pneumonia   -There is scar tissue in the bottom of the lung.  This probably reflects admission for pneumonia 15 years ago and COVID-19 in February 2020 and also repeat COVID-19 in August/September 2021   -At this point in time the not bothering you . However, radiologist feels worse since 2016 but stable 20210> 2022. Concern is you have ILD   - I think we need strategy around this    Plan -Do ILD questionnaire and bring it back with you next vist   -Do  Serum: ESR, ACE, ANA, DS-DNA, RF, anti-CCP,  ANCA screen, MPO, PR-3, Total CK,  scl-70, ssA, ssB, anti-RNP,  & Hypersensitivity Pneumonitis Panel and Quantiferon Gold - do 07/30/2021   - Do full PFT  next few months any time   Follow-up - return in Jan 2023/feb 2023 for 30 min visit buta fter completing above  - simple walk test at followup  - can discuss monitoring v biopsy approach at followup    SIGNATURE    Dr. Brand Males, M.D., F.C.C.P,  Pulmonary and Critical Care Medicine Staff Physician, [place] Director - Interstitial Lung Disease  Program  Pulmonary [place] at [place], [place], [address]  Pager: [phone], If no answer or between  15:00h -  7:00h: call [phone] Telephone: [phone]  11:05 AM 07/30/2021

## 2021-08-03 ENCOUNTER — Other Ambulatory Visit: Payer: Self-pay

## 2021-08-03 DIAGNOSIS — I83813 Varicose veins of bilateral lower extremities with pain: Secondary | ICD-10-CM

## 2021-08-04 LAB — QUANTIFERON-TB GOLD PLUS
Mitogen-NIL: 7.45 IU/mL
NIL: 0.95 IU/mL
QuantiFERON-TB Gold Plus: NEGATIVE
TB1-NIL: <0 IU/mL
TB2-NIL: <0 IU/mL

## 2021-08-04 LAB — SJOGREN'S SYNDROME ANTIBODS(SSA + SSB)
SSA (Ro) (ENA) Antibody, IgG: <1 AI
SSB (La) (ENA) Antibody, IgG: <1 AI

## 2021-08-04 LAB — CK TOTAL AND CKMB (NOT AT ARMC)
CK, MB: 1.1 ng/mL (ref 0–5.0)
Relative Index: 1.4 (ref 0–4.0)
Total CK: 78 U/L (ref 29–143)

## 2021-08-04 LAB — MPO/PR-3 (ANCA) ANTIBODIES
Myeloperoxidase Abs: <1 AI
Serine Protease 3: <1 AI

## 2021-08-04 LAB — RNP ANTIBODIES: ENA RNP Ab: 3.4 AI — ABNORMAL HIGH (ref 0.0–0.9)

## 2021-08-04 LAB — HYPERSENSITIVITY PNEUMONITIS
A. Pullulans Abs: NEGATIVE
A.Fumigatus #1 Abs: NEGATIVE
Micropolyspora faeni, IgG: NEGATIVE
Pigeon Serum Abs: NEGATIVE
Thermoact. Saccharii: NEGATIVE
Thermoactinomyces vulgaris, IgG: NEGATIVE

## 2021-08-04 LAB — CYCLIC CITRUL PEPTIDE ANTIBODY, IGG: Cyclic Citrullin Peptide Ab: <16 UNITS

## 2021-08-04 LAB — ANA: Anti Nuclear Antibody (ANA): NEGATIVE

## 2021-08-04 LAB — ANTI-SCLERODERMA ANTIBODY: Scleroderma (Scl-70) (ENA) Antibody, IgG: <1 AI

## 2021-08-04 LAB — ANGIOTENSIN CONVERTING ENZYME: Angiotensin-Converting Enzyme: 23 U/L (ref 9–67)

## 2021-08-04 LAB — RHEUMATOID FACTOR: Rheumatoid fact SerPl-aCnc: <14 IU/mL (ref <14)

## 2021-08-04 LAB — ANTI-DNA ANTIBODY, DOUBLE-STRANDED: ds DNA Ab: <1 IU/mL

## 2021-08-04 LAB — ANCA SCREEN W REFLEX TITER
ANCA Screen: POSITIVE — AB
Atypical P-ANCA titer: 1:160 {titer} — ABNORMAL HIGH

## 2021-08-18 ENCOUNTER — Ambulatory Visit (HOSPITAL_COMMUNITY)
Admission: RE | Admit: 2021-08-18 | Discharge: 2021-08-18 | Disposition: A | Discharge status: Home / Self Care | Payer: Medicare HMO | Source: Ambulatory Visit | Attending: Vascular Surgery | Admitting: Vascular Surgery

## 2021-08-18 ENCOUNTER — Ambulatory Visit (INDEPENDENT_AMBULATORY_CARE_PROVIDER_SITE_OTHER): Payer: Medicare HMO | Admitting: Vascular Surgery

## 2021-08-18 ENCOUNTER — Encounter: Payer: Self-pay | Admitting: Vascular Surgery

## 2021-08-18 ENCOUNTER — Other Ambulatory Visit: Payer: Self-pay

## 2021-08-18 VITALS — BP 122/65 | HR 50 | Temp 97.4°F | Resp 18 | Ht 62.0 in | Wt 237.9 lb

## 2021-08-18 DIAGNOSIS — I872 Venous insufficiency (chronic) (peripheral): Secondary | ICD-10-CM

## 2021-08-18 DIAGNOSIS — I83813 Varicose veins of bilateral lower extremities with pain: Secondary | ICD-10-CM | POA: Insufficient documentation

## 2021-08-18 NOTE — Progress Notes (Signed)
REASON FOR VISIT:   Follow-up of chronic venous insufficiency  MEDICAL ISSUES:   CHRONIC VENOUS INSUFFICIENCY: This patient has CEAP C4 venous disease (hyperpigmentation).  She has some reflux on the right in the superficial system but I do not think addressing this would significantly impact her symptoms.  In addition given her obesity there was significant technical challenges on the opposite side.  We have again discussed the importance of intermittent leg elevation and the proper positioning for this.  I have encouraged her to continue to wear her knee-high compression stockings.  We discussed the importance of exercise.  I encouraged her to avoid prolonged sitting and standing.  We also discussed the importance of maintaining a healthy weight.  In addition I have encouraged her to keep her skin well lubricated especially this time a year.  She will call if her symptoms progress in the future.  I will plan on seeing her back as needed.   HPI:   Judy Lucas is a pleasant 66 y.o. female who I have been following with chronic venous insufficiency.  She had originally presented with chronic venous stasis changes bilaterally.  She had failed conservative treatment and was felt to be a candidate for laser ablation of the left great saphenous vein.  However the vein was very deep and I was unable to technically perform laser ablation of the left great saphenous vein.  I was able to ablate the anterior accessory saphenous vein.  On her follow-up visit on 10/24/2019 she was noted to have an EHIT-3.  I treated her with Xarelto for 4 weeks.  Follow-up scan after this showed complete resolution of the EHIT.   She comes in today complaining of swelling and discomfort in both lower extremities.  Her symptoms are equal on both sides.  She has been wearing her knee-high compression stockings.  She does describe some aching pain in both legs which is aggravated by sitting and relieved with elevation.  She  has been wearing her compression stockings which help her symptoms some also.  She has been fairly active lately.  She tries to avoid prolonged sitting and standing.  She elevates her legs daily.  Of note, she does state that her symptoms in the left leg did improve after laser ablation of her anterior accessory saphenous vein.  Past Medical History:  Diagnosis Date   Abnormal CT of the chest 08/27/2019   Allergic rhinitis    Allergy    Chest discomfort 02/20/2019   Chest pain 01/22/2019   Dysphagia 01/22/2019   Hyperlipidemia    Hypertension    Morbid obesity ([place]) 01/22/2019   Osteoarthritis 01/22/2019   Renal insufficiency 01/22/2019    Family History  Problem Relation Age of Onset   Hyperlipidemia Mother    Hypertension Mother    Heart disease Mother    Heart attack Mother    Skin cancer Father    Hyperlipidemia Father    Hypertension Father    Heart disease Father    Heart attack Father    Hypertension Sister    Hyperlipidemia Sister    Hyperlipidemia Brother     SOCIAL HISTORY: Social History   Tobacco Use   Smoking status: Never   Smokeless tobacco: Never  Substance Use Topics   Alcohol use: Never    Allergies  Allergen Reactions   Meperidine Shortness Of Breath   Morphine Shortness Of Breath   Yellow Dyes (Non-Tartrazine) Anaphylaxis   Thyroid Swelling   Furosemide Rash  SWELLING ALLERGY SWELLING ALLERGY    Nitrofurantoin Rash   Nitroglycerin Palpitations   Penicillin G Rash   Sulfamethoxazole Rash    Current Outpatient Medications  Medication Sig Dispense Refill   aspirin EC 81 MG tablet Take 81 mg by mouth daily.     Cholecalciferol (VITAMIN D3) 50 MCG (2000 UT) capsule Take 2,000 Units by mouth daily.     fluticasone (FLONASE) 50 MCG/ACT nasal spray Place 2 sprays into both nostrils daily.     loratadine (CLARITIN) 10 MG tablet Take 1 tablet by mouth daily.     meclizine (ANTIVERT) 12.5 MG tablet Take 12.5 mg by mouth 3 (three) times daily as  needed for dizziness.     mirabegron ER (MYRBETRIQ) 25 MG TB24 tablet Take 25 mg by mouth daily.     montelukast (SINGULAIR) 10 MG tablet Take 10 mg by mouth at bedtime.     olmesartan (BENICAR) 20 MG tablet Take 10 mg by mouth daily.     omeprazole (PRILOSEC) 40 MG capsule Take 20 mg by mouth 2 (two) times daily.     simvastatin (ZOCOR) 10 MG tablet Take 10 mg by mouth daily.     B Complex-Biotin-FA (SUPER B-50 B COMPLEX) CAPS Take 1-2 capsules by mouth daily. Unknown strength (Patient not taking: Reported on 08/18/2021)     No current facility-administered medications for this visit.    REVIEW OF SYSTEMS:  [X]  denotes positive finding, [ ]  denotes negative finding Cardiac  Comments:  Chest pain or chest pressure:    Shortness of breath upon exertion:    Short of breath when lying flat:    Irregular heart rhythm:        Vascular    Pain in calf, thigh, or hip brought on by ambulation:    Pain in feet at night that wakes you up from your sleep:     Blood clot in your veins:    Leg swelling:  x       Pulmonary    Oxygen at home:    Productive cough:     Wheezing:         Neurologic    Sudden weakness in arms or legs:     Sudden numbness in arms or legs:     Sudden onset of difficulty speaking or slurred speech:    Temporary loss of vision in one eye:     Problems with dizziness:         Gastrointestinal    Blood in stool:     Vomited blood:         Genitourinary    Burning when urinating:     Blood in urine:        Psychiatric    Major depression:         Hematologic    Bleeding problems:    Problems with blood clotting too easily:        Skin    Rashes or ulcers:        Constitutional    Fever or chills:     PHYSICAL EXAM:   Vitals:   08/18/21 1414  BP: 122/65  Pulse: (!) 50  Resp: 18  Temp: (!) 97.4 F (36.3 C)  TempSrc: Temporal  SpO2: 98%  Weight: 237 lb 14.4 oz (107.9 kg)  Height: 5\' 2"  (1.575 m)   Body mass index is 43.51 kg/m.  GENERAL:  The patient is a well-nourished female, in no acute distress. The vital signs are documented above.  CARDIAC: There is a regular rate and rhythm.  VASCULAR: I do not detect carotid bruits. She has palpable pedal pulses. She has some varicose veins and spider veins bilaterally. She has hyperpigmentation bilaterally but mostly on the left side.         PULMONARY: There is good air exchange bilaterally without wheezing or rales. ABDOMEN: Soft and non-tender with normal pitched bowel sounds.  MUSCULOSKELETAL: There are no major deformities or cyanosis. NEUROLOGIC: No focal weakness or paresthesias are detected. SKIN: There are no ulcers or rashes noted. PSYCHIATRIC: The patient has a normal affect.  DATA:    VENOUS DUPLEX: I have independently interpreted her venous duplex scan today.  This was of the right lower extremity only.  There was no evidence of DVT.  There was deep venous reflux in the common femoral vein.  There was superficial venous reflux in the right great saphenous vein from the saphenofemoral junction to the knee.  Diameters of the vein ranged from 4 to 6 mm.     Deitra Mayo [facility] [phone]

## 2021-08-23 DIAGNOSIS — M19071 Primary osteoarthritis, right ankle and foot: Secondary | ICD-10-CM | POA: Diagnosis not present

## 2021-08-23 DIAGNOSIS — M19072 Primary osteoarthritis, left ankle and foot: Secondary | ICD-10-CM | POA: Diagnosis not present

## 2021-09-30 ENCOUNTER — Ambulatory Visit (INDEPENDENT_AMBULATORY_CARE_PROVIDER_SITE_OTHER): Payer: Medicare HMO | Admitting: Internal Medicine

## 2021-09-30 ENCOUNTER — Ambulatory Visit: Payer: Medicare HMO | Admitting: Internal Medicine

## 2021-09-30 ENCOUNTER — Other Ambulatory Visit: Payer: Self-pay

## 2021-09-30 ENCOUNTER — Encounter: Payer: Self-pay | Admitting: Internal Medicine

## 2021-09-30 VITALS — BP 128/70 | HR 51 | Ht 63.0 in | Wt 240.0 lb

## 2021-09-30 DIAGNOSIS — Z8616 Personal history of COVID-19: Secondary | ICD-10-CM

## 2021-09-30 DIAGNOSIS — R911 Solitary pulmonary nodule: Secondary | ICD-10-CM | POA: Diagnosis not present

## 2021-09-30 DIAGNOSIS — R768 Other specified abnormal immunological findings in serum: Secondary | ICD-10-CM

## 2021-09-30 DIAGNOSIS — R9389 Abnormal findings on diagnostic imaging of other specified body structures: Secondary | ICD-10-CM

## 2021-09-30 LAB — PULMONARY FUNCTION TEST
DL/VA % pred: 127 %
DL/VA: 5.35 ml/min/mmHg/L
DLCO cor % pred: 110 %
DLCO cor: 21.06 ml/min/mmHg
DLCO unc % pred: 110 %
DLCO unc: 21.06 ml/min/mmHg
FEF 25-75 Pre: 2.93 L/sec
FEF2575-%Pred-Pre: 147 %
FEV1-%Pred-Pre: 93 %
FEV1-Pre: 2.12 L
FEV1FVC-%Pred-Pre: 113 %
FEV6-%Pred-Pre: 85 %
FEV6-Pre: 2.43 L
FEV6FVC-%Pred-Pre: 104 %
FVC-%Pred-Pre: 81 %
FVC-Pre: 2.43 L
Pre FEV1/FVC ratio: 87 %
Pre FEV6/FVC Ratio: 100 %

## 2021-09-30 NOTE — Patient Instructions (Addendum)
Solitary pulmonary nodule  -15 mm nodule right costophrenic angle stable between December 2020 in December 2021. NO change in Dec 2022 CT  Plan - expectant followup  Abnormal CT of the chest -early interstitial lung abnormalities December 2021 and dec 2022 Personal history of COVID-19 History of community acquired pneumonia remote   -There is scar tissue in the bottom of the lung.  This probably reflects admission for pneumonia 15 years ago and COVID-19 in February 2020 and also repeat COVID-19 in August/September 2021  - PFT improved fev 2022 -> feb 2023  - Curently asymptomatic   Plan - surveillance approach  - avoiding biopsy or empiric treatment approach -spiro/dlco - feb/march 2024 - HRCT supine and prone in feb/march 2023   RNP antibiody positive P-ANCA positive  Plan   - expectant approach per shared decision making  Follow-up - return in 1 year  for 30 min visit buta fter completing above  - simple walk test at followup  Followup

## 2021-09-30 NOTE — Progress Notes (Signed)
Spirometry/DLCO performed today. 

## 2021-09-30 NOTE — Patient Instructions (Addendum)
Spirometry/DLCO performed today. 

## 2021-09-30 NOTE — Progress Notes (Signed)
OV 09/29/2020  Subjective:  Patient ID: Judy Lucas, female , DOB: Dec 28, 1955 , age 66 y.o. , MRN: [id number] , ADDRESS: [address] [place] [address] PCP Nicholos Johns, MD Patient Care Team: Nicholos Johns, MD as PCP - General (Internal Medicine)  This Provider for this visit: Treatment Team:  Attending Provider: Brand Males, MD    09/29/2020 -   Chief Complaint  Patient presents with   Follow-up    Doing well  .   ICD-10-CM   1. Solitary pulmonary nodule  R91.1   2. Abnormal CT of the chest  R93.89   3. Personal history of COVID-19  Z86.16   4. History of community acquired pneumonia  Z87.01      HPI Judy Lucas 66 y.o. -follow-up for the above issues.  She was a patient of Dr. Carlis Abbott.  Dr. Carlis Abbott is now in the ICU rotation service.  Patient is being transferred to Dr. Chase Caller.  She is solitary pulmonary nodule 15 mm in the right costophrenic angle.  She had follow-up 1 year scan in December 2021 and it is stable.  She has early interstitial lung abnormalities in the lung bases.  According to the radiologist it is worse.  According to the patient she is asymptomatic without any shortness of breath or cough wheezing orthopnea proximal nocturnal dyspnea.  She denies any acid reflux.  She denies any tobacco smoking.  She is a retired Sports coach and former Marine scientist as well.  There is no mold or mildew in the house.  No feather pillow.  She has a maple tree allergy that seasonal but otherwise she is fine.  She gives a history of community-acquired pneumonia and hospitalization 15 years ago.  Also COVID-19 in February 2020 and repeat in August/September 2021 for which she was not hospitalized.  She believes the scar tissue might be related to that.    CT Chest data  Narrative & Impression  CLINICAL DATA:  Pulmonary nodule, follow-up examination   EXAM: CT CHEST WITHOUT CONTRAST   TECHNIQUE: Multidetector CT imaging of the chest was performed following  the standard protocol without IV contrast.   COMPARISON:  07/19/2019, 06/18/2015   FINDINGS: Cardiovascular: No significant coronary artery calcification. Global cardiac size within normal limits. No pericardial effusion. Central pulmonary arteries are of normal caliber. Mild atherosclerotic calcifications seen within the thoracic aorta. No aortic aneurysm.   Mediastinum/Nodes: Visualized thyroid unremarkable. No pathologic thoracic adenopathy. Esophagus unremarkable.   Lungs/Pleura: Ovoid nodule seen within the right costophrenic angle at axial image # 113 is stable measuring 7 mm x 15 mm (average 11 mm). Focal nodule within the focus of parenchymal scarring within the right lower lobe, previously seen on axial image # 78, has resolved. 3 mm nodule within the left upper lobe, axial image # 27, is stable. Subpleural ground-glass pulmonary infiltrate with associated interlobular septal thickening at the lung bases bilaterally has progressed slightly since prior examination, likely reflecting progressive fibrotic change. No pneumothorax or pleural effusion. Central airways are widely patent.   Upper Abdomen: Cholecystectomy has been performed. No acute abnormality.   Musculoskeletal: No acute bone abnormality.   IMPRESSION: Stable dominant pulmonary nodule within the right lower lobe. Continued imaging follow-up is recommended in 1 year to document continued stability. This recommendation follows the consensus statement: Guidelines for Management of Incidental Pulmonary Nodules Detected on CT Images: From the Fleischner Society 2017; Radiology 2017; 284:228-243.   Mild interval progression of probable bibasilar pulmonary fibrotic  change.   Aortic Atherosclerosis (ICD10-I70.0).     Electronically Signed   By: Fidela Salisbury MD   On: 07/17/2020 04:26          OV 07/30/2021  Subjective:  Patient ID: Judy Lucas, female , DOB: 1955-09-05 , age 66 y.o. , MRN:  [id number] , ADDRESS: [address] [place] [address] PCP Nicholos Johns, MD Patient Care Team: Nicholos Johns, MD as PCP - General (Internal Medicine)  This Provider for this visit: Treatment Team:  Attending Provider: Brand Males, MD    07/30/2021 -   Chief Complaint  Patient presents with   Follow-up    Pt is here to discuss results of recent CT.  Pt states she has been doing okay since last visit and denies any complaints.   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 -last seen earlier this year.  Since then stable no interim COVID.  No interim issues.  Early morning she has occasional dry cough from what she thinks is allergies.  No shortness of breath with exertion.  Sometimes she feels muscle pull on the back of her chest but otherwise no issues.  This is baseline and chronic.  She had high-resolution CT scan of the chest that shows right lower lobe nodule is stable and radiologist does deem this is benign.  However they are concerned about ILD changes.  I did visualize this and agree with them.  They are saying it is worst in 2016 but stable in the last 1 year.  I did share this with the patient.  Did explain to her that some of the ILD's could get worse.  She is interested in getting a work-up done and following physician recommendation on this.  She is somewhat reassured that the ILD is deemed stable in the last 1 year on CT scan.  Of note her husband is also here with her today.    CT Chest data  CT Chest High Resolution  Result Date: 07/28/2021 CLINICAL DATA:  Pulmonary nodule, possible pulmonary fibrosis, evaluate for progression EXAM: CT CHEST WITHOUT CONTRAST TECHNIQUE: Multidetector CT imaging of the chest was performed  following the standard protocol without intravenous contrast. High resolution imaging of the lungs, as well as inspiratory and expiratory imaging, was performed. COMPARISON:  07/16/2020, 07/19/2019, 04/25/2019, 06/18/2015 FINDINGS: Cardiovascular: Scattered aortic atherosclerosis. Cardiomegaly. No pericardial effusion. Mediastinum/Nodes: No enlarged mediastinal, hilar, or axillary lymph nodes. Thyroid gland, trachea, and esophagus demonstrate no significant findings. Lungs/Pleura: Mild pulmonary fibrosis in a pattern with apical to basal gradient, featuring irregular peripheral interstitial opacity and septal thickening without evidence of bronchiolectasis or honeycombing. Fibrosis is not significantly changed compared to immediate prior examination. Mild lobular air trapping on expiratory phase imaging. Stable, definitively benign nodule of the right lung base measuring 1.4 x 0.9 cm (series 5, image 230). No pleural effusion or pneumothorax. Upper Abdomen: No acute abnormality.  Status post cholecystectomy. Musculoskeletal: No chest wall mass or suspicious bone lesions identified. IMPRESSION: 1. Mild pulmonary fibrosis in a pattern with apical to basal gradient, featuring irregular peripheral interstitial opacity and septal thickening without evidence of bronchiolectasis or honeycombing. Fibrotic findings are not significantly changed compared to immediate prior examination although clearly worsened over time dating back to 06/18/2015. Strictly characterized, findings are (early) indeterminate for UIP per  consensus guidelines, although progression over time is concerning for UIP: Diagnosis of Idiopathic Pulmonary Fibrosis: An Official ATS/ERS/JRS/ALAT Clinical Practice Guideline. [place], Iss 5, [phone], Apr 15 2017. 2. Stable, definitively benign nodule of the right lung base. No further follow-up is specifically required for this benign nodule. 3. Cardiomegaly. Aortic Atherosclerosis  (ICD10-I70.0). Electronically Signed   By: Delanna Ahmadi M.D.   On: 07/28/2021 14:49      P OV 09/30/2021  Subjective:  Patient ID: Judy Lucas, female , DOB: [birth year] , age 63 y.o. , MRN: [id number] , ADDRESS: [address] [place] [address] PCP Nicholos Johns, MD Patient Care Team: Nicholos Johns, MD as PCP - General (Internal Medicine)  This Provider for this visit: Treatment Team:  Attending Provider: Brand Males, MD  Follow-up lung nodule right lower lobe Follow-up possible ILD   -  pneumonia 15 years ago and COVID-19 in February 2020 and also repeat COVID-19 in August/September 2021  - radiologist feels worse since 2016 but stable 20210> 2022.   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  09/30/2021 -   Chief Complaint  Patient presents with   Follow-up    PFT performed today.  Pt states she has been doing okay since last visit and denies any complaints.     HPI Karyn Brull 66 y.o. -returns for follow-up.  She is doing well.  According to her husband she is wheezing less and is less short of breath now.  She had pulmonary function testing that shows an improvement compared to 1 year ago and it is actually normal now.  In addition a walking desaturation test is normal.  She had autoimmune profile and this shows RNP antibody positive and p-ANCA positive.  She tells me that she has a chronic history of allergies and also some kind of enzyme deficiency and so she think she has autoimmune issues but it sounds like it is more like maple tree allergies she takes Kenalog shots earlier in the year.  She is also allergic to various food items.  I did share with her the autoimmune antibody results.  She prefers a more expectant approach and seeing a rheumatologist.  Also given her improvement in pulmonary function test and normal walking desaturation test and absence of crackles on lung exam today  we took a shared decision making approach to continue surveillance as opposed to going getting a lung biopsy or starting empiric treatment.  Specifically her ILD question at was done in detail and the details are below here  [facility] Comprehensive ILD Questionnaire  Symptoms:   Shortness of Breath 0 -> 5 scale with 5 being worst (score 6 If unable to do)  At rest 0  Simple tasks - showers, clothes change, eating, shaving 0  Household (dishes, doing bed, laundry) 0  Shopping 0  Walking level at own pace 0  Walking up Stairs 0  Total (30-36) Dyspnea Score 0      Non-dyspnea symptoms (0-> 5 scale) 09/30/2021  How bad is your cough? Mild due to allergies  How bad is your fatigue 0  How bad is nausea 0  How bad is vomiting?  00  How bad is diarrhea? 0  How bad is anxiety? 0  How bad is depression 0  Any chronic pain - if so where and how bad 0     Past Medical History :  GERD +  Allergies Hx of ppna Hx of cpovid   ROS:  Arthralgia Occasionally has sore throat and takes omeprazole Has dry eyes Has allergies and rash  FAMILY HISTORY of LUNG DISEASE:  Negative*  PERSONAL EXPOSURE HISTORY:  No smoking no vaping.  No marijuana use no cocaine use  HOME  EXPOSURE and HOBBY DETAILS :  [place]  Age of the home is 47-1/2 years.  She has lived there since then.  Detail organic antigen exposure history in the house is negative  OCCUPATIONAL HISTORY (122 questions) : -Did some tobacco growing as a young person.  Has done some laboratory work.  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  Simple office walk 185 feet x  3 laps goal  with forehead probe 09/30/2021    O2 used ra   Number laps completed 3   Comments about pace avg   Resting Pulse Ox/HR 100% and 51/min   Final Pulse Ox/HR 99% and 86/min   Desaturated </= 88% no   Desaturated <= 3% points no   Got Tachycardic >/= 90/min no   Symptoms at end of test none   Miscellaneous comments Nomal tste      PFT  PFT Results Latest Ref Rng & Units 09/30/2021 09/29/2020  FVC-Pre L 2.43 2.30  FVC-Predicted Pre % 81 76  FVC-Post L - 2.45  FVC-Predicted Post % - 81  Pre FEV1/FVC % % 87 86  Post FEV1/FCV % % - 87  FEV1-Pre L 2.12 1.97  FEV1-Predicted Pre % 93 85  FEV1-Post L - 2.15  DLCO uncorrected ml/min/mmHg 21.06 18.86  DLCO UNC% % 110 98  DLCO corrected ml/min/mmHg 21.06 18.86  DLCO COR %Predicted % 110 98  DLVA Predicted % 127 122  TLC L - 3.79  TLC % Predicted % - 77  RV % Predicted % - 71    Latest Reference Range & Units 07/27/20 10:19 07/30/21 11:06  Anti Nuclear Antibody (ANA) NEGATIVE   NEGATIVE  Angiotensin-Converting Enzyme 9 - 67 U/L  23  Cyclic Citrullin Peptide Ab UNITS <16 <16  ds DNA Ab IU/mL  <1  ENA RNP Ab 0.0 - 0.9 AI  3.4 (H)  Myeloperoxidase Abs AI  <1.0  Serine Protease 3 AI  <1.0  RA Latex Turbid. <14 IU/mL <14 <14  Atypical P-ANCA titer <1:20 titer  1:160 (H)  SSA (Ro) (ENA) Antibody, IgG <1.0 NEG AI <1.0 NEG <1.0 NEG  SSB (La) (ENA) Antibody, IgG <1.0 NEG AI <1.0 NEG <1.0 NEG  Scleroderma (Scl-70) (ENA) Antibody, IgG <1.0 NEG AI  <1.0 NEG  (H): Data is abnormally high   Latest Reference Range & Units 07/30/21 11:06  A.Fumigatus #1 Abs Negative  Negative  Micropolyspora faeni, IgG Negative  Negative  Thermoactinomyces vulgaris, IgG Negative  Negative  A. Pullulans Abs Negative  Negative  Thermoact. Saccharii Negative  Negative  [place] Serum Abs Negative  Negative    has a past medical history of Abnormal CT of the chest (08/27/2019), Allergic rhinitis, Allergy, Chest discomfort (02/20/2019), Chest pain (01/22/2019), Dysphagia  (01/22/2019), Hyperlipidemia, Hypertension, Morbid obesity ([place]) (01/22/2019), Osteoarthritis (01/22/2019), and Renal insufficiency (01/22/2019).   reports that she  has never smoked. She has never used smokeless tobacco.  Past Surgical History:  Procedure Laterality Date   ABDOMINAL HYSTERECTOMY     APPENDECTOMY     CHOLECYSTECTOMY     ENDOVENOUS ABLATION SAPHENOUS VEIN W/ LASER Left 10/17/2019   endovenous laser ablation anterior accessory branch of left greater saphenous vein by Gae Gallop MD    feet surgery     WRIST SURGERY      Allergies  Allergen Reactions   Meperidine Shortness Of Breath   Morphine Shortness Of Breath   Yellow Dyes (Non-Tartrazine) Anaphylaxis   Thyroid Swelling   Furosemide Rash    SWELLING ALLERGY SWELLING ALLERGY    Nitrofurantoin Rash   Nitroglycerin Palpitations   Penicillin G Rash   Sulfamethoxazole Rash    Immunization History  Administered Date(s) Administered   Fluad Quad(high Dose 65+) 05/27/2020   Influenza, High Dose Seasonal PF 05/27/2021   Influenza,inj,Quad PF,6-35 Mos 05/16/2019   PFIZER(Purple Top)SARS-COV-2 Vaccination 09/09/2019, 09/23/2019, 07/22/2020   Zoster Recombinat (Shingrix) 05/14/2018    Family History  Problem Relation Age of Onset   Hyperlipidemia Mother    Hypertension Mother    Heart disease Mother    Heart attack Mother    Skin cancer Father    Hyperlipidemia Father    Hypertension Father    Heart disease Father    Heart attack Father    Hypertension Sister    Hyperlipidemia Sister    Hyperlipidemia Brother      Current Outpatient Medications:    aspirin EC 81 MG tablet, Take 81 mg by mouth daily., Disp: , Rfl:    Cholecalciferol (VITAMIN D3) 50 MCG (2000 UT) capsule, Take 2,000 Units by mouth daily., Disp: , Rfl:    fluticasone (FLONASE) 50 MCG/ACT nasal spray, Place 2 sprays into both nostrils daily., Disp: , Rfl:    loratadine (CLARITIN) 10 MG tablet, Take 1 tablet by mouth daily., Disp: , Rfl:     meclizine (ANTIVERT) 12.5 MG tablet, Take 12.5 mg by mouth 3 (three) times daily as needed for dizziness., Disp: , Rfl:    mirabegron ER (MYRBETRIQ) 25 MG TB24 tablet, Take 25 mg by mouth daily., Disp: , Rfl:    montelukast (SINGULAIR) 10 MG tablet, Take 10 mg by mouth at bedtime., Disp: , Rfl:    olmesartan (BENICAR) 20 MG tablet, Take 10 mg by mouth daily., Disp: , Rfl:    omeprazole (PRILOSEC) 40 MG capsule, Take 20 mg by mouth 2 (two) times daily., Disp: , Rfl:    simvastatin (ZOCOR) 10 MG tablet, Take 10 mg by mouth daily., Disp: , Rfl:       Objective:   Vitals:   09/30/21 1028  BP: 128/70  Pulse: (!) 51  SpO2: 100%  Weight: 240 lb (108.9 kg)  Height: 5\' 3"  (1.6 m)    Estimated body mass index is 42.51 kg/m as calculated from the following:   Height as of this encounter: 5\' 3"  (1.6 m).   Weight as of this encounter: 240 lb (108.9 kg).  @WEIGHTCHANGE @  [employer]   09/30/21 1028  Weight: 240 lb (108.9 kg)     Physical Exam General: No distress. Looks well Neuro: Alert and Oriented x 3. GCS 15. Speech normal Psych: Pleasant Resp:  Barrel Chest - no.  Wheeze - no, Crackles - no, No overt respiratory distress CVS: Normal heart sounds. Murmurs - no Ext: Stigmata of Connective Tissue Disease - no HEENT: Normal upper airway. PEERL +. No post nasal drip  Assessment:       ICD-10-CM   1. Solitary pulmonary nodule  R91.1     2. Abnormal CT of the chest  R93.89     3. Personal history of COVID-19  Z86.16     4. Anti-RNP antibodies present  R76.8          Plan:     Patient Instructions  Solitary pulmonary nodule  -15 mm nodule right costophrenic angle stable between December 2020 in December 2021. NO change in Dec 2022 CT  Plan - expectant followup  Abnormal CT of the chest -early interstitial lung abnormalities December 2021 and dec 2022 Personal history of COVID-19 History of community acquired pneumonia remote   -There is scar tissue in  the bottom of the lung.  This probably reflects admission for pneumonia 15 years ago and COVID-19 in February 2020 and also repeat COVID-19 in August/September 2021  - PFT improved fev 2022 -> feb 2023  - Curently asymptomatic   Plan - surveillance approach  - avoiding biopsy or empiric treatment approach -spiro/dlco - feb/march 2024 - HRCT supine and prone in feb/march 2023   RNP antibiody positive P-ANCA positive  Plan   - expectant approach per shared decision making  Follow-up - return in 1 year  for 30 min visit buta fter completing above  - simple walk test at followup  Followup    SIGNATURE    Dr. Brand Males, M.D., F.C.C.P,  Pulmonary and Critical Care Medicine Staff Physician, [place] Director - Interstitial Lung Disease  Program  [facility] [place] [place], [place], [address]  Pager: [phone], If no answer or between  15:00h - 7:00h: call [phone] Telephone: [phone]  11:12 AM 09/30/2021

## 2021-10-12 DIAGNOSIS — H524 Presbyopia: Secondary | ICD-10-CM | POA: Diagnosis not present

## 2021-10-15 ENCOUNTER — Other Ambulatory Visit: Payer: Self-pay

## 2021-10-18 ENCOUNTER — Other Ambulatory Visit: Payer: Self-pay

## 2021-10-18 ENCOUNTER — Ambulatory Visit: Payer: Medicare HMO | Admitting: Cardiology

## 2021-10-18 ENCOUNTER — Encounter: Payer: Self-pay | Admitting: Cardiology

## 2021-10-18 VITALS — BP 142/68 | HR 56 | Ht 62.0 in | Wt 237.0 lb

## 2021-10-18 DIAGNOSIS — E782 Mixed hyperlipidemia: Secondary | ICD-10-CM | POA: Diagnosis not present

## 2021-10-18 DIAGNOSIS — I10 Essential (primary) hypertension: Secondary | ICD-10-CM

## 2021-10-18 NOTE — Patient Instructions (Signed)

## 2021-10-18 NOTE — Progress Notes (Signed)
?Cardiology Office Note:   ? ?Date:  10/18/2021  ? ?ID:  Judy Lucas, DOB 27-Oct-1955, MRN [id number] ? ?PCP:  Nicholos Johns, MD  ?Cardiologist:  Jenean Lindau, MD  ? ?Referring MD: Nicholos Johns, MD  ? ? ?ASSESSMENT:   ? ?1. Primary hypertension   ?2. Mixed hyperlipidemia   ?3. Morbid obesity ([place])   ? ?PLAN:   ? ?In order of problems listed above: ? ?Primary prevention stressed with the patient.  Importance of compliance with diet and medication stressed and she vocalized understanding.  She was advised to walk at least half an hour a day 5 days a week and she promises to do so. ?Essential hypertension: Stable blood pressure and she has an element of whitecoat hypertension.  She is a Marine scientist by profession.  Diet, salt intake issues were discussed. ?Mixed dyslipidemia: On statin therapy and managed by primary care.  Lipids were fine from last evaluation. ?Abnormal CT scan of the chest with pulmonary pathology followed by pulmonary specialist.  She is well aware of this. ?Calcium score of 0 on recent testing. ?Morbid obesity: Weight reduction stressed and diet emphasized.  Risks of obesity revisited and she promises to do better. ?Patient will be seen in follow-up appointment in 6 months or earlier if the patient has any concerns ? ? ? ?Medication Adjustments/Labs and Tests Ordered: ?Current medicines are reviewed at length with the patient today.  Concerns regarding medicines are outlined above.  ?No orders of the defined types were placed in this encounter. ? ?No orders of the defined types were placed in this encounter. ? ? ? ?No chief complaint on file. ?  ? ?History of Present Illness:   ? ?Judy Lucas is a 66 y.o. female.  Patient was evaluated by me.  She has history of essential hypertension and dyslipidemia.  She denies any problems at this time and takes care of activities of daily living.  She is a Marine scientist by profession.  She tells me that her blood pressure is fine at home and she just took her medication  moments before she came to our office.  She keeps a good track of her blood pressures.  She tries to walk on a regular basis.  She has had an abnormal CT of the chest and is followed by pulmonary. ? ?Past Medical History:  ?Diagnosis Date  ? Abnormal CT of the chest 08/27/2019  ? Allergic rhinitis   ? Allergy   ? Chest discomfort 02/20/2019  ? Chest pain 01/22/2019  ? Dysphagia 01/22/2019  ? Hyperlipidemia   ? Hypertension   ? Morbid obesity ([place]) 01/22/2019  ? Osteoarthritis 01/22/2019  ? Pharyngoesophageal dysphagia 01/22/2019  ? Last Assessment & Plan:  Formatting of this note might be different from the original. Concern over dysphagia.  She has intermittent periods where she will have difficulty with swallowing.  They are very brief in nature.  She had an esophageal dilation in the past that temporarily helped.  She is on omeprazole once a day. EXAM shows normal oral cavity and oropharynx.  Neck is without adenopathy or  ? Renal insufficiency 01/22/2019  ? Sensorineural hearing loss (SNHL) of both ears 02/24/2021  ? Last Assessment & Plan:  Formatting of this note might be different from the original. Concern over her hearing. She has known sensorineural hearing loss bilaterally and wears hearing aids.  She recently was seen by her audiologist who noted something on testing that concerned her and was sent for evaluation.  In general,  she is happy with her hearing with her hearing aids. EXAM shows normal exter  ? ? ?Past Surgical History:  ?Procedure Laterality Date  ? ABDOMINAL HYSTERECTOMY    ? APPENDECTOMY    ? CHOLECYSTECTOMY    ? ENDOVENOUS ABLATION SAPHENOUS VEIN W/ LASER Left 10/17/2019  ? endovenous laser ablation anterior accessory branch of left greater saphenous vein by Gae Gallop MD   ? feet surgery    ? WRIST SURGERY    ? ? ?Current Medications: ?Current Meds  ?Medication Sig  ? aspirin EC 81 MG tablet Take 81 mg by mouth daily.  ? Cholecalciferol (VITAMIN D3) 50 MCG (2000 UT) capsule Take 2,000 Units by mouth  daily.  ? fluticasone (FLONASE) 50 MCG/ACT nasal spray Place 2 sprays into both nostrils daily.  ? loratadine (CLARITIN) 10 MG tablet Take 1 tablet by mouth daily.  ? meclizine (ANTIVERT) 12.5 MG tablet Take 12.5 mg by mouth 3 (three) times daily as needed for dizziness.  ? mirabegron ER (MYRBETRIQ) 25 MG TB24 tablet Take 25 mg by mouth daily.  ? montelukast (SINGULAIR) 10 MG tablet Take 10 mg by mouth at bedtime.  ? olmesartan (BENICAR) 40 MG tablet Take 20 mg by mouth daily.  ? omeprazole (PRILOSEC) 20 MG capsule Take 20 mg by mouth daily.  ? oxybutynin (DITROPAN-XL) 10 MG 24 hr tablet Take 10 mg by mouth daily.  ? simvastatin (ZOCOR) 10 MG tablet Take 10 mg by mouth daily.  ? spironolactone (ALDACTONE) 50 MG tablet Take 50 mg by mouth daily.  ?  ? ?Allergies:   Meperidine, Morphine, Yellow dyes (non-tartrazine), Thyroid, Furosemide, Nitrofurantoin, Nitroglycerin, Penicillin g, and Sulfamethoxazole  ? ?Social History  ? ?Socioeconomic History  ? Marital status: Married  ?  Spouse name: Not on file  ? Number of children: Not on file  ? Years of education: Not on file  ? Highest education level: Not on file  ?Occupational History  ? Not on file  ?Tobacco Use  ? Smoking status: Never  ? Smokeless tobacco: Never  ?Vaping Use  ? Vaping Use: Never used  ?Substance and Sexual Activity  ? Alcohol use: Never  ? Drug use: Never  ? Sexual activity: Not on file  ?Other Topics Concern  ? Not on file  ?Social History Narrative  ? Not on file  ? ?Social Determinants of Health  ? ?Financial Resource Strain: Not on file  ?Food Insecurity: Not on file  ?Transportation Needs: Not on file  ?Physical Activity: Not on file  ?Stress: Not on file  ?Social Connections: Not on file  ?  ? ?Family History: ?The patient's family history includes Heart attack in her father and mother; Heart disease in her father and mother; Hyperlipidemia in her brother, father, mother, and sister; Hypertension in her father, mother, and sister; Skin cancer in  her father. ? ?ROS:   ?Please see the history of present illness.    ?All other systems reviewed and are negative. ? ?EKGs/Labs/Other Studies Reviewed:   ? ?The following studies were reviewed today: ?IMPRESSION: ?1. Coronary calcium score of 0. This was 0 percentile for age and ?sex matched control. ?  ?2. Normal coronary origin with right dominance. ?  ?3. Study quality affected by patient's size, however tere is no ?evidence of CAD. ?  ?  ?Electronically Signed ?  By: Ena Dawley ?  On: 02/04/2019 21:32 ? ? ?Recent Labs: ?No results found for requested labs within last 8760 hours.  ?Recent Lipid Panel ?No  results found for: CHOL, TRIG, HDL, CHOLHDL, VLDL, LDLCALC, LDLDIRECT ? ?Physical Exam:   ? ?VS:  BP (!) 142/68   Pulse (!) 56   Ht '5\' 2"'$  (1.575 m)   Wt 237 lb (107.5 kg)   SpO2 96%   BMI 43.35 kg/m?    ? ?Wt Readings from Last 3 Encounters:  ?10/18/21 237 lb (107.5 kg)  ?09/30/21 240 lb (108.9 kg)  ?08/18/21 237 lb 14.4 oz (107.9 kg)  ?  ? ?GEN: Patient is in no acute distress ?HEENT: Normal ?NECK: No JVD; No carotid bruits ?LYMPHATICS: No lymphadenopathy ?CARDIAC: Hear sounds regular, 2/6 systolic murmur at the apex. ?RESPIRATORY:  Clear to auscultation without rales, wheezing or rhonchi  ?ABDOMEN: Soft, non-tender, non-distended ?MUSCULOSKELETAL:  No edema; No deformity  ?SKIN: Warm and dry ?NEUROLOGIC:  Alert and oriented x 3 ?PSYCHIATRIC:  Normal affect  ? ?Signed, ?Jenean Lindau, MD  ?10/18/2021 9:14 AM    ?[place]  ?

## 2021-10-19 DIAGNOSIS — R600 Localized edema: Secondary | ICD-10-CM | POA: Diagnosis not present

## 2021-10-19 DIAGNOSIS — E559 Vitamin D deficiency, unspecified: Secondary | ICD-10-CM | POA: Diagnosis not present

## 2021-10-19 DIAGNOSIS — N289 Disorder of kidney and ureter, unspecified: Secondary | ICD-10-CM | POA: Diagnosis not present

## 2021-10-19 DIAGNOSIS — I82812 Embolism and thrombosis of superficial veins of left lower extremities: Secondary | ICD-10-CM | POA: Diagnosis not present

## 2021-10-19 DIAGNOSIS — I10 Essential (primary) hypertension: Secondary | ICD-10-CM | POA: Diagnosis not present

## 2021-10-19 DIAGNOSIS — E785 Hyperlipidemia, unspecified: Secondary | ICD-10-CM | POA: Diagnosis not present

## 2021-10-19 DIAGNOSIS — Z8739 Personal history of other diseases of the musculoskeletal system and connective tissue: Secondary | ICD-10-CM | POA: Diagnosis not present

## 2021-10-19 DIAGNOSIS — E119 Type 2 diabetes mellitus without complications: Secondary | ICD-10-CM | POA: Diagnosis not present

## 2021-10-27 ENCOUNTER — Other Ambulatory Visit: Payer: Self-pay

## 2021-10-27 ENCOUNTER — Ambulatory Visit (INDEPENDENT_AMBULATORY_CARE_PROVIDER_SITE_OTHER)
Admission: RE | Admit: 2021-10-27 | Discharge: 2021-10-27 | Disposition: A | Discharge status: Home / Self Care | Payer: Medicare HMO | Source: Ambulatory Visit | Attending: Internal Medicine | Admitting: Internal Medicine

## 2021-10-27 DIAGNOSIS — R911 Solitary pulmonary nodule: Secondary | ICD-10-CM

## 2021-10-27 DIAGNOSIS — R918 Other nonspecific abnormal finding of lung field: Secondary | ICD-10-CM | POA: Diagnosis not present

## 2021-10-27 DIAGNOSIS — R9389 Abnormal findings on diagnostic imaging of other specified body structures: Secondary | ICD-10-CM

## 2021-10-27 DIAGNOSIS — J84112 Idiopathic pulmonary fibrosis: Secondary | ICD-10-CM | POA: Diagnosis not present

## 2021-11-01 ENCOUNTER — Telehealth: Payer: Self-pay | Admitting: Internal Medicine

## 2021-11-01 NOTE — Telephone Encounter (Signed)
Attempted to call pt but unable to reach and unable to leave VM due to mailbox not being set up. Will try to call back later. 

## 2021-11-01 NOTE — Telephone Encounter (Signed)
Nodule stable March 2023 compared to 3 months ago ?Fibrosis also stable in 3 months ? ?Also my apologies. I was suppsoed to get this CT next year 2024 but did typo and wrote mar 2023.  ? ?In anny event,  I will see her at  1 year followup andper recent OV plan and decide if any further CT needed ? ? ?No results found. ? ?

## 2021-11-01 NOTE — Telephone Encounter (Signed)
Spoke with patient to let her know results from Dr. Chase Caller. She expressed understanding. Nothing further needed at this time.  ?

## 2021-11-29 DIAGNOSIS — M19072 Primary osteoarthritis, left ankle and foot: Secondary | ICD-10-CM | POA: Diagnosis not present

## 2021-11-29 DIAGNOSIS — M19071 Primary osteoarthritis, right ankle and foot: Secondary | ICD-10-CM | POA: Diagnosis not present

## 2021-12-01 DIAGNOSIS — Z1231 Encounter for screening mammogram for malignant neoplasm of breast: Secondary | ICD-10-CM | POA: Diagnosis not present

## 2022-01-25 DIAGNOSIS — R600 Localized edema: Secondary | ICD-10-CM | POA: Diagnosis not present

## 2022-01-25 DIAGNOSIS — I82812 Embolism and thrombosis of superficial veins of left lower extremities: Secondary | ICD-10-CM | POA: Diagnosis not present

## 2022-01-25 DIAGNOSIS — Z8739 Personal history of other diseases of the musculoskeletal system and connective tissue: Secondary | ICD-10-CM | POA: Diagnosis not present

## 2022-01-25 DIAGNOSIS — E119 Type 2 diabetes mellitus without complications: Secondary | ICD-10-CM | POA: Diagnosis not present

## 2022-01-25 DIAGNOSIS — E559 Vitamin D deficiency, unspecified: Secondary | ICD-10-CM | POA: Diagnosis not present

## 2022-01-25 DIAGNOSIS — E1151 Type 2 diabetes mellitus with diabetic peripheral angiopathy without gangrene: Secondary | ICD-10-CM | POA: Diagnosis not present

## 2022-01-25 DIAGNOSIS — I10 Essential (primary) hypertension: Secondary | ICD-10-CM | POA: Diagnosis not present

## 2022-01-25 DIAGNOSIS — Z79899 Other long term (current) drug therapy: Secondary | ICD-10-CM | POA: Diagnosis not present

## 2022-01-25 DIAGNOSIS — J309 Allergic rhinitis, unspecified: Secondary | ICD-10-CM | POA: Diagnosis not present

## 2022-01-25 DIAGNOSIS — I83813 Varicose veins of bilateral lower extremities with pain: Secondary | ICD-10-CM | POA: Diagnosis not present

## 2022-01-25 DIAGNOSIS — E785 Hyperlipidemia, unspecified: Secondary | ICD-10-CM | POA: Diagnosis not present

## 2022-02-21 DIAGNOSIS — M19072 Primary osteoarthritis, left ankle and foot: Secondary | ICD-10-CM | POA: Diagnosis not present

## 2022-02-21 DIAGNOSIS — M19071 Primary osteoarthritis, right ankle and foot: Secondary | ICD-10-CM | POA: Diagnosis not present

## 2022-04-27 DIAGNOSIS — I872 Venous insufficiency (chronic) (peripheral): Secondary | ICD-10-CM | POA: Diagnosis not present

## 2022-04-27 DIAGNOSIS — I739 Peripheral vascular disease, unspecified: Secondary | ICD-10-CM | POA: Diagnosis not present

## 2022-04-27 DIAGNOSIS — I82812 Embolism and thrombosis of superficial veins of left lower extremities: Secondary | ICD-10-CM | POA: Diagnosis not present

## 2022-04-27 DIAGNOSIS — Z6841 Body Mass Index (BMI) 40.0 and over, adult: Secondary | ICD-10-CM | POA: Diagnosis not present

## 2022-05-02 DIAGNOSIS — M6702 Short Achilles tendon (acquired), left ankle: Secondary | ICD-10-CM

## 2022-05-02 DIAGNOSIS — M7662 Achilles tendinitis, left leg: Secondary | ICD-10-CM | POA: Insufficient documentation

## 2022-05-02 DIAGNOSIS — M7732 Calcaneal spur, left foot: Secondary | ICD-10-CM

## 2022-05-02 HISTORY — DX: Short Achilles tendon (acquired), left ankle: M67.02

## 2022-05-02 HISTORY — DX: Achilles tendinitis, left leg: M76.62

## 2022-05-02 HISTORY — DX: Calcaneal spur, left foot: M77.32

## 2022-05-10 DIAGNOSIS — R600 Localized edema: Secondary | ICD-10-CM | POA: Diagnosis not present

## 2022-05-10 DIAGNOSIS — Z79899 Other long term (current) drug therapy: Secondary | ICD-10-CM | POA: Diagnosis not present

## 2022-05-10 DIAGNOSIS — I10 Essential (primary) hypertension: Secondary | ICD-10-CM | POA: Diagnosis not present

## 2022-05-10 DIAGNOSIS — Z8739 Personal history of other diseases of the musculoskeletal system and connective tissue: Secondary | ICD-10-CM | POA: Diagnosis not present

## 2022-05-10 DIAGNOSIS — E559 Vitamin D deficiency, unspecified: Secondary | ICD-10-CM | POA: Diagnosis not present

## 2022-05-10 DIAGNOSIS — E785 Hyperlipidemia, unspecified: Secondary | ICD-10-CM | POA: Diagnosis not present

## 2022-05-10 DIAGNOSIS — E119 Type 2 diabetes mellitus without complications: Secondary | ICD-10-CM | POA: Diagnosis not present

## 2022-05-10 DIAGNOSIS — R413 Other amnesia: Secondary | ICD-10-CM | POA: Diagnosis not present

## 2022-05-31 ENCOUNTER — Other Ambulatory Visit: Payer: Self-pay | Admitting: *Deleted

## 2022-05-31 DIAGNOSIS — I872 Venous insufficiency (chronic) (peripheral): Secondary | ICD-10-CM

## 2022-05-31 DIAGNOSIS — I83813 Varicose veins of bilateral lower extremities with pain: Secondary | ICD-10-CM

## 2022-06-02 DIAGNOSIS — J45901 Unspecified asthma with (acute) exacerbation: Secondary | ICD-10-CM | POA: Diagnosis not present

## 2022-06-02 DIAGNOSIS — R0602 Shortness of breath: Secondary | ICD-10-CM | POA: Diagnosis not present

## 2022-06-07 DIAGNOSIS — M65971 Unspecified synovitis and tenosynovitis, right ankle and foot: Secondary | ICD-10-CM

## 2022-06-07 DIAGNOSIS — M659 Synovitis and tenosynovitis, unspecified: Secondary | ICD-10-CM

## 2022-06-07 DIAGNOSIS — M7662 Achilles tendinitis, left leg: Secondary | ICD-10-CM | POA: Diagnosis not present

## 2022-06-07 DIAGNOSIS — M65972 Unspecified synovitis and tenosynovitis, left ankle and foot: Secondary | ICD-10-CM | POA: Insufficient documentation

## 2022-06-07 HISTORY — DX: Unspecified synovitis and tenosynovitis, right ankle and foot: M65.971

## 2022-06-07 HISTORY — DX: Unspecified synovitis and tenosynovitis, left ankle and foot: M65.972

## 2022-06-09 ENCOUNTER — Encounter: Payer: Self-pay | Admitting: Vascular Surgery

## 2022-06-09 ENCOUNTER — Ambulatory Visit (HOSPITAL_COMMUNITY)
Admission: RE | Admit: 2022-06-09 | Discharge: 2022-06-09 | Disposition: A | Discharge status: Home / Self Care | Payer: Medicare HMO | Source: Ambulatory Visit | Attending: Vascular Surgery | Admitting: Vascular Surgery

## 2022-06-09 ENCOUNTER — Ambulatory Visit: Payer: Medicare HMO | Admitting: Vascular Surgery

## 2022-06-09 VITALS — BP 142/73 | HR 50 | Temp 97.9°F | Resp 20 | Ht 62.0 in | Wt 236.0 lb

## 2022-06-09 DIAGNOSIS — I872 Venous insufficiency (chronic) (peripheral): Secondary | ICD-10-CM | POA: Insufficient documentation

## 2022-06-09 DIAGNOSIS — I83813 Varicose veins of bilateral lower extremities with pain: Secondary | ICD-10-CM | POA: Insufficient documentation

## 2022-06-09 NOTE — Progress Notes (Signed)
REASON FOR VISIT:   Follow-up of chronic venous insufficiency.  MEDICAL ISSUES:   CHRONIC VENOUS INSUFFICIENCY: This patient has CEAP C6 venous disease.  Her wound is gradually improving.  We have again discussed the importance of leg elevation and the proper positioning for this.  I have asked her to get some thigh-high compression stockings at elastic therapy with a gradient of 20 to 30 mmHg.  We discussed the importance of avoiding prolonged sitting and standing.  I also encouraged her to exercise.  We have also discussed the importance of maintaining a healthy weight.  This is certainly a contributing factor.  Her BMI is 43.  I plan on seeing her back in 3 months.  If her symptoms continue to progress I think it would be reasonable to consider laser ablation of the left great saphenous vein in the proximal and mid thigh.  We had previously attempted this 3 years ago and were unsuccessful because of the depth.  However I think it would be reasonable to try again if her symptoms persist.  Of note she does not have any significant deep venous reflux.  She previously had an EHIT-3 so certainly is at risk for this again.  We would likely try to stay well away from the sapheno-femoral junction.  If she had persistent symptoms or the wounds fail to heal we could also address the reflux in the small saphenous vein.  Again we would stay well away from the deep system given her previous history.  Currently I do not think addressing the anterior accessory saphenous vein would significantly impact her venous hypertension.  I will see her back in 3 months and make further recommendations pending these results.  HPI:   Judy Lucas is a pleasant 66 y.o. female who I last saw in January of this year with chronic venous insufficiency.  At that time she had CEAP C4 venous disease.  The patient had previously failed conservative treatment.  On 10/17/2019 I had attempted laser ablation of the left great  saphenous vein but because of the depth of the vein I was then able to perform the procedure.  I did address a short anterior accessory saphenous vein but this was technically challenging again because of the distance involved.  On a follow-up study on 10/24/2019 she had an EHIT-3.  This was treated with a short course of Xarelto.  Follow-up study showed complete resolution of this clot.  Of note, on the right side she did have reflux in the superficial system but I was not convinced that addressing this would significantly impact her symptoms.  We discussed conservative measures again.  She comes in for a follow-up visit.  On my history, she developed a wound above her left medial malleolus in the summer.  Thus this was about 4 months ago.  The wound has slowly improved once she began using [place] on it.  She does try to elevate her legs at night.  She has been wearing knee-high compression stockings.  Because of her body habitus these are somewhat hard to fit but she has been getting some stockings that are reasonably comfortable from elastic therapy.  The patient has been having worsening symptoms consistent with venous hypertension.  She describes aching pain and heaviness in the leg which is aggravated by standing and sitting and relieved with compression therapy and elevation.  Her symptoms are more significant on the left side.  Past Medical History:  Diagnosis Date   Abnormal CT of  the chest 08/27/2019   Allergic rhinitis    Allergy    Chest discomfort 02/20/2019   Chest pain 01/22/2019   Dysphagia 01/22/2019   Hyperlipidemia    Hypertension    Morbid obesity ([place]) 01/22/2019   Osteoarthritis 01/22/2019   Pharyngoesophageal dysphagia 01/22/2019   Last Assessment & Plan:  Formatting of this note might be different from the original. Concern over dysphagia.  She has intermittent periods where she will have difficulty with swallowing.  They are very brief in nature.  She had an esophageal dilation in  the past that temporarily helped.  She is on omeprazole once a day. EXAM shows normal oral cavity and oropharynx.  Neck is without adenopathy or   Renal insufficiency 01/22/2019   Sensorineural hearing loss (SNHL) of both ears 02/24/2021   Last Assessment & Plan:  Formatting of this note might be different from the original. Concern over her hearing. She has known sensorineural hearing loss bilaterally and wears hearing aids.  She recently was seen by her audiologist who noted something on testing that concerned her and was sent for evaluation.  In general, she is happy with her hearing with her hearing aids. EXAM shows normal exter    Family History  Problem Relation Age of Onset   Hyperlipidemia Mother    Hypertension Mother    Heart disease Mother    Heart attack Mother    Skin cancer Father    Hyperlipidemia Father    Hypertension Father    Heart disease Father    Heart attack Father    Hypertension Sister    Hyperlipidemia Sister    Hyperlipidemia Brother     SOCIAL HISTORY: Social History   Tobacco Use   Smoking status: Never   Smokeless tobacco: Never  Substance Use Topics   Alcohol use: Never    Allergies  Allergen Reactions   Meperidine Shortness Of Breath   Morphine Shortness Of Breath   Yellow Dyes (Non-Tartrazine) Anaphylaxis   Thyroid Swelling   Furosemide Rash    SWELLING ALLERGY SWELLING ALLERGY    Nitrofurantoin Rash   Nitroglycerin Palpitations   Penicillin G Rash   Sulfamethoxazole Rash    Current Outpatient Medications  Medication Sig Dispense Refill   aspirin EC 81 MG tablet Take 81 mg by mouth daily.     Cholecalciferol (VITAMIN D3) 50 MCG (2000 UT) capsule Take 2,000 Units by mouth daily.     fluticasone (FLONASE) 50 MCG/ACT nasal spray Place 2 sprays into both nostrils daily.     loratadine (CLARITIN) 10 MG tablet Take 1 tablet by mouth daily.     meclizine (ANTIVERT) 12.5 MG tablet Take 12.5 mg by mouth 3 (three) times daily as needed for  dizziness.     mirabegron ER (MYRBETRIQ) 25 MG TB24 tablet Take 25 mg by mouth daily.     montelukast (SINGULAIR) 10 MG tablet Take 10 mg by mouth at bedtime.     olmesartan (BENICAR) 40 MG tablet Take 20 mg by mouth daily.     omeprazole (PRILOSEC) 20 MG capsule Take 20 mg by mouth daily.     oxybutynin (DITROPAN-XL) 10 MG 24 hr tablet Take 10 mg by mouth daily.     simvastatin (ZOCOR) 10 MG tablet Take 10 mg by mouth daily.     spironolactone (ALDACTONE) 50 MG tablet Take 50 mg by mouth daily.     No current facility-administered medications for this visit.    REVIEW OF SYSTEMS:  '[X]'$  denotes positive finding, '[ ]'$   denotes negative finding Cardiac  Comments:  Chest pain or chest pressure:    Shortness of breath upon exertion:    Short of breath when lying flat:    Irregular heart rhythm:        Vascular    Pain in calf, thigh, or hip brought on by ambulation:    Pain in feet at night that wakes you up from your sleep:     Blood clot in your veins:    Leg swelling:  x       Pulmonary    Oxygen at home:    Productive cough:     Wheezing:         Neurologic    Sudden weakness in arms or legs:     Sudden numbness in arms or legs:     Sudden onset of difficulty speaking or slurred speech:    Temporary loss of vision in one eye:     Problems with dizziness:         Gastrointestinal    Blood in stool:     Vomited blood:         Genitourinary    Burning when urinating:     Blood in urine:        Psychiatric    Major depression:         Hematologic    Bleeding problems:    Problems with blood clotting too easily:        Skin    Rashes or ulcers: x       Constitutional    Fever or chills:     PHYSICAL EXAM:   Vitals:   06/09/22 1607  BP: (!) 142/73  Pulse: (!) 50  Resp: 20  Temp: 97.9 F (36.6 C)  SpO2: 98%  Weight: 236 lb (107 kg)  Height: '5\' 2"'$  (1.575 m)   Body mass index is 43.16 kg/m.  GENERAL: The patient is a well-nourished female, in no acute  distress. The vital signs are documented above. CARDIAC: There is a regular rate and rhythm.  VASCULAR: I do not detect carotid bruits. On the right side she has a palpable dorsalis pedis and posterior tibial pulse. On the left side, which is the side with the wound, she has a palpable posterior tibial pulse.  I cannot palpate a dorsalis pedis pulse however she has a biphasic dorsalis pedis and posterior tibial signal with the Doppler. She has hyperpigmentation and lipodermatosclerosis bilaterally but especially on the left. The wound on the left leg is gradually improving.   I did look at her superficial veins on the left leg today with the SonoSite.  She does have 3 areas of reflux. The right great saphenous vein has reflux down to the mid thigh where it exits the fascia.  The vein is dilated with diameters ranging from 6 to 8 mm.  I measured it about 2 and half to 3 cm in depth at this level. The anterior accessory saphenous vein does have some reflux but is fairly small about 4 mm in diameter. The small saphenous vein has reflux and is dilated.  There is a communication with the popliteal vein and also a vein of Giacomini which extends up the thigh but this is fairly small.  PULMONARY: There is good air exchange bilaterally without wheezing or rales. ABDOMEN: Soft and non-tender with normal pitched bowel sounds.  MUSCULOSKELETAL: There are no major deformities or cyanosis. NEUROLOGIC: No focal weakness or paresthesias are detected. SKIN: There are  no ulcers or rashes noted. PSYCHIATRIC: The patient has a normal affect.  DATA:    VENOUS DUPLEX: I have independently interpreted her venous duplex scan today.  This was in the left lower extremity only.  There was no evidence of DVT.  There was no deep venous reflux.  There was superficial venous reflux in multiple areas.  First there was reflux in the great saphenous vein from the saphenofemoral junction to the mid thigh where the diameters of  the vein ranged from 6 to 8 mm.  In the mid thigh this exited the fascia.  Below that the saphenous vein was very small.  Second, the anterior accessory saphenous vein had reflux with diameters ranging from 3.7-4.3 mm.  Third, the small saphenous vein had reflux from the saphenous popliteal junction to the mid calf.  Diameters of the vein ranged from 4.3-5.6 mm.  The results are summarized on the diagram below.    A total of 40 minutes was spent on this visit. 22 minutes was face to face time. More than 50% of the time was spent on counseling and coordinating with the patient.    Deitra Mayo [facility] [phone]

## 2022-06-10 DIAGNOSIS — E785 Hyperlipidemia, unspecified: Secondary | ICD-10-CM | POA: Diagnosis not present

## 2022-06-10 DIAGNOSIS — Z6841 Body Mass Index (BMI) 40.0 and over, adult: Secondary | ICD-10-CM | POA: Diagnosis not present

## 2022-06-10 DIAGNOSIS — L500 Allergic urticaria: Secondary | ICD-10-CM | POA: Diagnosis not present

## 2022-06-10 DIAGNOSIS — I10 Essential (primary) hypertension: Secondary | ICD-10-CM | POA: Diagnosis not present

## 2022-06-10 DIAGNOSIS — Z86718 Personal history of other venous thrombosis and embolism: Secondary | ICD-10-CM | POA: Diagnosis not present

## 2022-06-10 DIAGNOSIS — Z883 Allergy status to other anti-infective agents status: Secondary | ICD-10-CM | POA: Diagnosis not present

## 2022-07-14 ENCOUNTER — Other Ambulatory Visit: Payer: Self-pay

## 2022-07-18 ENCOUNTER — Encounter: Payer: Self-pay | Admitting: Cardiology

## 2022-07-18 ENCOUNTER — Ambulatory Visit: Payer: Medicare HMO | Attending: Cardiology | Admitting: Cardiology

## 2022-07-18 VITALS — BP 140/64 | HR 57 | Ht 62.0 in | Wt 237.2 lb

## 2022-07-18 DIAGNOSIS — I10 Essential (primary) hypertension: Secondary | ICD-10-CM | POA: Diagnosis not present

## 2022-07-18 DIAGNOSIS — E782 Mixed hyperlipidemia: Secondary | ICD-10-CM | POA: Diagnosis not present

## 2022-07-18 NOTE — Progress Notes (Signed)
Cardiology Office Note:    Date:  07/18/2022   ID:  Judy Lucas, DOB Dec 19, 1955, MRN [id number]  PCP:  Nicholos Johns, MD  Cardiologist:  Jenean Lindau, MD   Referring MD: Nicholos Johns, MD    ASSESSMENT:    1. Mixed hyperlipidemia   2. Primary hypertension   3. Morbid obesity ([place])    PLAN:    In order of problems listed above:  Primary prevention stressed with the patient.  Importance of compliance with diet medication stressed and she vocalized understanding. Essential hypertension: Blood pressure stable and diet was emphasized.  Lifestyle modification urged. Morbid obesity: Weight reduction stressed and she promises to do better.  She mentions to me that she has been traveling and lax with diet. Abnormal CT chest: Followed by pulmonologist.  She is in close follow-up with them.  She is a retired Marine scientist by profession.  She understands the significance of this. Patient mentions to me that she gets her circulation checked in her lower extremities by her vascular specialist.  Her circulation in both lower extremities is not perfect normally and I wanted to do an ABI but I am fine with the vascular specialist following up on this closely.  I discussed this with her at length and questions were answered to her satisfaction. Patient will be seen in follow-up appointment in 9 months or earlier if the patient has any concerns    Medication Adjustments/Labs and Tests Ordered: Current medicines are reviewed at length with the patient today.  Concerns regarding medicines are outlined above.  No orders of the defined types were placed in this encounter.  No orders of the defined types were placed in this encounter.    No chief complaint on file.    History of Present Illness:    Judy Lucas is a 66 y.o. female.  Patient has past medical history of essential hypertension, dyslipidemia and morbid obesity.  She denies any problems from a cardiovascular standpoint.  No chest pain  orthopnea or PND.  She mentions to me that she has seen her vascular surgeon in follow-up in the next few days.  Also she mentions to me that she follows with the pulmonologist for abnormal CT of the chest.  At the time of my evaluation, the patient is alert awake oriented and in no distress.  Past Medical History:  Diagnosis Date   Abnormal CT of the chest 08/27/2019   Achilles tendinitis of left lower extremity 05/02/2022   Allergic rhinitis    Allergy    Chest discomfort 02/20/2019   Chest pain 01/22/2019   Dysphagia 01/22/2019   Heel spur, left 05/02/2022   Hyperlipidemia    Hypertension    Morbid obesity ([place]) 01/22/2019   Osteoarthritis 01/22/2019   Pharyngoesophageal dysphagia 01/22/2019   Last Assessment & Plan:  Formatting of this note might be different from the original. Concern over dysphagia.  She has intermittent periods where she will have difficulty with swallowing.  They are very brief in nature.  She had an esophageal dilation in the past that temporarily helped.  She is on omeprazole once a day. EXAM shows normal oral cavity and oropharynx.  Neck is without adenopathy or   Renal insufficiency 01/22/2019   Sensorineural hearing loss (SNHL) of both ears 02/24/2021   Last Assessment & Plan:  Formatting of this note might be different from the original. Concern over her hearing. She has known sensorineural hearing loss bilaterally and wears hearing aids.  She recently was seen by  her audiologist who noted something on testing that concerned her and was sent for evaluation.  In general, she is happy with her hearing with her hearing aids. EXAM shows normal exter   Tenosynovitis of left ankle 06/07/2022   Tenosynovitis of right ankle 06/07/2022   Tightness of heel cord, left 05/02/2022    Past Surgical History:  Procedure Laterality Date   ABDOMINAL HYSTERECTOMY     APPENDECTOMY     CHOLECYSTECTOMY     ENDOVENOUS ABLATION SAPHENOUS VEIN W/ LASER Left 10/17/2019    endovenous laser ablation anterior accessory branch of left greater saphenous vein by Gae Gallop MD    feet surgery     WRIST SURGERY      Current Medications: Current Meds  Medication Sig   aspirin EC 81 MG tablet Take 81 mg by mouth daily.   Cholecalciferol (VITAMIN D3) 50 MCG (2000 UT) capsule Take 2,000 Units by mouth daily.   fluticasone (FLONASE) 50 MCG/ACT nasal spray Place 2 sprays into both nostrils daily.   loratadine (CLARITIN) 10 MG tablet Take 1 tablet by mouth daily.   meclizine (ANTIVERT) 12.5 MG tablet Take 12.5 mg by mouth 3 (three) times daily as needed for dizziness.   montelukast (SINGULAIR) 10 MG tablet Take 10 mg by mouth at bedtime.   olmesartan (BENICAR) 20 MG tablet Take 20 mg by mouth daily.   omeprazole (PRILOSEC) 20 MG capsule Take 20 mg by mouth daily.   oxybutynin (DITROPAN-XL) 10 MG 24 hr tablet Take 10 mg by mouth daily.   simvastatin (ZOCOR) 10 MG tablet Take 10 mg by mouth daily.   spironolactone (ALDACTONE) 50 MG tablet Take 50 mg by mouth daily.     Allergies:   Meperidine, Morphine, Yellow dyes (non-tartrazine), Clindamycin, Thyroid, Furosemide, Nitrofurantoin, Nitroglycerin, Penicillin g, and Sulfamethoxazole   Social History   Socioeconomic History   Marital status: Married    Spouse name: Not on file   Number of children: Not on file   Years of education: Not on file   Highest education level: Not on file  Occupational History   Not on file  Tobacco Use   Smoking status: Never   Smokeless tobacco: Never  Vaping Use   Vaping Use: Never used  Substance and Sexual Activity   Alcohol use: Never   Drug use: Never   Sexual activity: Not on file  Other Topics Concern   Not on file  Social History Narrative   Not on file   Social Determinants of Health   Financial Resource Strain: Not on file  Food Insecurity: Not on file  Transportation Needs: Not on file  Physical Activity: Not on file  Stress: Not on file  Social Connections:  Not on file     Family History: The patient's family history includes Heart attack in her father and mother; Heart disease in her father and mother; Hyperlipidemia in her brother, father, mother, and sister; Hypertension in her father, mother, and sister; Skin cancer in her father.  ROS:   Please see the history of present illness.    All other systems reviewed and are negative.  EKGs/Labs/Other Studies Reviewed:    The following studies were reviewed today: EKG reveals sinus rhythm and nonspecific ST-T changes   Recent Labs: No results found for requested labs within last 365 days.  Recent Lipid Panel No results found for: "CHOL", "TRIG", "HDL", "CHOLHDL", "VLDL", "LDLCALC", "LDLDIRECT"  Physical Exam:    VS:  BP (!) 140/64   Pulse Marland Kitchen)  57   Ht '5\' 2"'$  (1.575 m)   Wt 237 lb 3.2 oz (107.6 kg)   SpO2 95%   BMI 43.38 kg/m     Wt Readings from Last 3 Encounters:  07/18/22 237 lb 3.2 oz (107.6 kg)  06/09/22 236 lb (107 kg)  10/18/21 237 lb (107.5 kg)     GEN: Patient is in no acute distress HEENT: Normal NECK: No JVD; No carotid bruits LYMPHATICS: No lymphadenopathy CARDIAC: Hear sounds regular, 2/6 systolic murmur at the apex. RESPIRATORY:  Clear to auscultation without rales, wheezing or rhonchi  ABDOMEN: Soft, non-tender, non-distended MUSCULOSKELETAL:  No edema; No deformity  SKIN: Warm and dry NEUROLOGIC:  Alert and oriented x 3 PSYCHIATRIC:  Normal affect   Signed, Jenean Lindau, MD  07/18/2022 2:18 PM    [facility]

## 2022-07-18 NOTE — Patient Instructions (Signed)
Medication Instructions:  Your physician recommends that you continue on your current medications as directed. Please refer to the Current Medication list given to you today.  *If you need a refill on your cardiac medications before your next appointment, please call your pharmacy*   Lab Work: None Ordered If you have labs (blood work) drawn today and your tests are completely normal, you will receive your results only by: [place] (if you have MyChart) OR A paper copy in the mail If you have any lab test that is abnormal or we need to change your treatment, we will call you to review the results.   Testing/Procedures: None Ordered   Follow-Up: At [facility], you and your health needs are our priority.  As part of our continuing mission to provide you with exceptional heart care, we have created designated Provider Care Teams.  These Care Teams include your primary Cardiologist (physician) and Advanced Practice Providers (APPs -  Physician Assistants and Nurse Practitioners) who all work together to provide you with the care you need, when you need it.  We recommend signing up for the patient portal called "MyChart".  Sign up information is provided on this After Visit Summary.  MyChart is used to connect with patients for Virtual Visits (Telemedicine).  Patients are able to view lab/test results, encounter notes, upcoming appointments, etc.  Non-urgent messages can be sent to your provider as well.   To learn more about what you can do with MyChart, go to [email or web address].    Your next appointment:   9 month(s)  The format for your next appointment:   In Person  Provider:   Jenne Campus, MD    Other Instructions NA

## 2022-08-02 ENCOUNTER — Encounter: Payer: Self-pay | Admitting: Gastroenterology

## 2022-08-11 ENCOUNTER — Telehealth: Payer: Self-pay | Admitting: Internal Medicine

## 2022-08-11 DIAGNOSIS — R911 Solitary pulmonary nodule: Secondary | ICD-10-CM

## 2022-08-12 NOTE — Telephone Encounter (Signed)
Called and spoke with patient and let her know that yes MR wanted a new CT scan of her chest for next year. Order placed and I told her we would try to get the CT scan done before her follow up if possible. Order placed. Nothing further needed

## 2022-08-23 DIAGNOSIS — M19072 Primary osteoarthritis, left ankle and foot: Secondary | ICD-10-CM | POA: Diagnosis not present

## 2022-08-23 DIAGNOSIS — M19071 Primary osteoarthritis, right ankle and foot: Secondary | ICD-10-CM | POA: Diagnosis not present

## 2022-09-08 DIAGNOSIS — Z79899 Other long term (current) drug therapy: Secondary | ICD-10-CM | POA: Diagnosis not present

## 2022-09-08 DIAGNOSIS — E119 Type 2 diabetes mellitus without complications: Secondary | ICD-10-CM | POA: Diagnosis not present

## 2022-09-08 DIAGNOSIS — Z6841 Body Mass Index (BMI) 40.0 and over, adult: Secondary | ICD-10-CM | POA: Diagnosis not present

## 2022-09-08 DIAGNOSIS — E785 Hyperlipidemia, unspecified: Secondary | ICD-10-CM | POA: Diagnosis not present

## 2022-09-08 DIAGNOSIS — R600 Localized edema: Secondary | ICD-10-CM | POA: Diagnosis not present

## 2022-09-08 DIAGNOSIS — I10 Essential (primary) hypertension: Secondary | ICD-10-CM | POA: Diagnosis not present

## 2022-09-08 DIAGNOSIS — Z9181 History of falling: Secondary | ICD-10-CM | POA: Diagnosis not present

## 2022-09-08 DIAGNOSIS — E559 Vitamin D deficiency, unspecified: Secondary | ICD-10-CM | POA: Diagnosis not present

## 2022-09-08 DIAGNOSIS — S81802A Unspecified open wound, left lower leg, initial encounter: Secondary | ICD-10-CM | POA: Diagnosis not present

## 2022-09-14 ENCOUNTER — Ambulatory Visit: Payer: Medicare HMO | Admitting: Vascular Surgery

## 2022-09-14 ENCOUNTER — Encounter: Payer: Self-pay | Admitting: Vascular Surgery

## 2022-09-14 VITALS — BP 140/76 | HR 54 | Temp 97.7°F | Resp 16 | Ht 62.0 in | Wt 237.0 lb

## 2022-09-14 DIAGNOSIS — I872 Venous insufficiency (chronic) (peripheral): Secondary | ICD-10-CM | POA: Diagnosis not present

## 2022-09-14 NOTE — Progress Notes (Signed)
REASON FOR VISIT:   Follow-up of chronic venous insufficiency  MEDICAL ISSUES:   CHRONIC VENOUS INSUFFICIENCY: This patient has a venous ulcer of the left leg which she has had since the summer 2023.  She has C6 venous disease.  She has been doing thigh-high compression, elevation and exercise.  The wound has not made significant progress.  She has no evidence of arterial insufficiency.  She has no significant deep venous reflux.  She has multiple areas of superficial venous reflux.  Based on my review of her superficial veins with the [employer] I think the small saphenous vein is likely contributing most to her venous hypertension of the ankle and I think she would be a good candidate for laser ablation of the left small saphenous vein.  She appears to have a vein of Giacomini and no connection to the popliteal vein.  We have discussed the indications for laser ablation and the potential complications including but not limited to DVT and failure of the vein to close.  If she has persistent ulceration despite addressing her small saphenous vein we could consider staged ablation of the left great saphenous vein in the proximal and mid thigh and potentially the anterior accessory saphenous vein.    HPI:   Izella Ybanez is a pleasant 67 y.o. female who I last saw on 06/09/2022.  She has C6 venous disease.  She has a somewhat comp gated history.  I had previously attempted laser ablation of the left great saphenous vein in March 2021.  The vein was very deep and ultimately I was unable to perform the procedure.  I did address a short anterior accessory saphenous vein but this too was technically challenging because of the depth of the vein.  On a follow-up study she had an E HIT 3 which was treated with a short course of Xarelto.  When I saw her last she developed a wound to her left medial malleolus which she had had for about 4 months.  The wound was gradually improving.  We discussed importance of  leg elevation and compression therapy and I set her up for 11-monthfollow-up visit.  I felt that if her symptoms progressed we could consider laser ablation of the left great saphenous vein in the proximal and mid thigh.  Given her history we would stay well away from the saphenofemoral junction.  Since I saw her last, the wound on the left leg has not improved significantly.  She has had this since the summer 2023.  She describes aching pain and heaviness in the left leg which is aggravated by sitting and standing and relieved with elevation.  She has minimal symptoms on the right side.  She denies any chest pain or shortness of breath.   Past Medical History:  Diagnosis Date   Abnormal CT of the chest 08/27/2019   Achilles tendinitis of left lower extremity 05/02/2022   Allergic rhinitis    Allergy    Anxiety    Chest discomfort 02/20/2019   Chest pain 01/22/2019   Choline deficiency    Can only tolerate versed for procedures   Diverticulosis    Dysphagia 01/22/2019   Heel spur, left 05/02/2022   Hyperlipidemia    Hypertension    [place] 01/22[place]   Osteoarthritis 01/22/2019   Pharyngoesophageal dysphagia 01/22/2019   Last Assessment & Plan:  Formatting of this note might be different from the original. Concern over dysphagia.  She has intermittent periods where she will have  difficulty with swallowing.  They are very brief in nature.  She had an esophageal dilation in the past that temporarily helped.  She is on omeprazole once a day. EXAM shows normal oral cavity and oropharynx.  Neck is without adenopathy or   Pneumonia 05/2015   Renal insufficiency 01/22/2019   Sensorineural hearing loss (SNHL) of both ears 02/24/2021   Last Assessment & Plan:  Formatting of this note might be different from the original. Concern over her hearing. She has known sensorineural hearing loss bilaterally and wears hearing aids.  She recently was seen by her audiologist who noted something  on testing that concerned her and was sent for evaluation.  In general, she is happy with her hearing with her hearing aids. EXAM shows normal exter   Tenosynovitis of left ankle 06/07/2022   Tenosynovitis of right ankle 06/07/2022   Tightness of heel cord, left 05/02/2022    Family History  Problem Relation Age of Onset   Hyperlipidemia Mother    Hypertension Mother    Heart disease Mother    Heart attack Mother    Skin cancer Father    Hyperlipidemia Father    Hypertension Father    Heart disease Father    Heart attack Father    Hypertension Sister    Hyperlipidemia Sister    Hyperlipidemia Brother     SOCIAL HISTORY: Social History   Tobacco Use   Smoking status: Never   Smokeless tobacco: Never  Substance Use Topics   Alcohol use: Never    Allergies  Allergen Reactions   Meperidine Shortness Of Breath   Morphine Shortness Of Breath   Yellow Dyes (Non-Tartrazine) Anaphylaxis   Clindamycin Rash    Significant erythroderma with the itching associated with clindamycin.  No swelling, wheezing, dyspnea, or airway compromise.   Thyroid Swelling   Furosemide Rash    SWELLING ALLERGY SWELLING ALLERGY    Nitrofurantoin Rash   Nitroglycerin Palpitations   Penicillin G Rash   Sulfamethoxazole Rash    Current Outpatient Medications  Medication Sig Dispense Refill   aspirin EC 81 MG tablet Take 81 mg by mouth daily.     Cholecalciferol (VITAMIN D3) 50 MCG (2000 UT) capsule Take 2,000 Units by mouth daily.     fluticasone (FLONASE) 50 MCG/ACT nasal spray Place 2 sprays into both nostrils daily.     loratadine (CLARITIN) 10 MG tablet Take 1 tablet by mouth daily.     meclizine (ANTIVERT) 12.5 MG tablet Take 12.5 mg by mouth 3 (three) times daily as needed for dizziness.     montelukast (SINGULAIR) 10 MG tablet Take 10 mg by mouth at bedtime.     olmesartan (BENICAR) 20 MG tablet Take 20 mg by mouth daily.     omeprazole (PRILOSEC) 20 MG capsule Take 20 mg by mouth  daily.     oxybutynin (DITROPAN-XL) 10 MG 24 hr tablet Take 10 mg by mouth daily.     simvastatin (ZOCOR) 10 MG tablet Take 10 mg by mouth daily.     spironolactone (ALDACTONE) 50 MG tablet Take 50 mg by mouth daily.     No current facility-administered medications for this visit.    REVIEW OF SYSTEMS:  '[X]'$  denotes positive finding, '[ ]'$  denotes negative finding Cardiac  Comments:  Chest pain or chest pressure:    Shortness of breath upon exertion:    Short of breath when lying flat:    Irregular heart rhythm: x       Vascular  Pain in calf, thigh, or hip brought on by ambulation:    Pain in feet at night that wakes you up from your sleep:     Blood clot in your veins:    Leg swelling:  x       Pulmonary    Oxygen at home:    Productive cough:     Wheezing:         Neurologic    Sudden weakness in arms or legs:     Sudden numbness in arms or legs:     Sudden onset of difficulty speaking or slurred speech:    Temporary loss of vision in one eye:     Problems with dizziness:         Gastrointestinal    Blood in stool:     Vomited blood:         Genitourinary    Burning when urinating:     Blood in urine:        Psychiatric    Major depression:         Hematologic    Bleeding problems:    Problems with blood clotting too easily:        Skin    Rashes or ulcers: x       Constitutional    Fever or chills:     PHYSICAL EXAM:   Vitals:   09/14/22 1315  BP: (!) 140/76  Pulse: (!) 54  Resp: 16  Temp: 97.7 F (36.5 C)  TempSrc: Temporal  SpO2: 99%  Weight: 107.5 kg  Height: '5\' 2"'$  (1.575 m)   GENERAL: The patient is a well-nourished female, in no acute distress. The vital signs are documented above. CARDIAC: There is a regular rate and rhythm.  VASCULAR: I do not detect carotid bruits. She has palpable dorsalis pedis pulses.  I could not palpate posterior tibial pulses however she had biphasic posterior tibial signal bilaterally. I lifted her superficial  veins on the left side.  She does have reflux in the anterior sensory saphenous vein although the vein is not relatively short segment.  There is reflux in the great saphenous vein in the proximal and mid thigh.  The vein exits the fascia in the mid thigh.  Given the depth again it may be difficult to cannulate.  Alternatively we could consider cannulating the superficial branch to try to get into the deep system.  The small saphenous vein has significant reflux and does not appear to connect to the popliteal vein and extends up the thigh as a vein of Giacomini    MUSCULOSKELETAL: There are no major deformities or cyanosis. NEUROLOGIC: No focal weakness or paresthesias are detected. SKIN:   PSYCHIATRIC: The patient has a normal affect.  DATA:    VENOUS DUPLEX: The results of her venous duplex scan that was done in October 2023 is noted below.    Deitra Mayo [facility] [phone]

## 2022-09-15 ENCOUNTER — Ambulatory Visit: Payer: Medicare HMO | Admitting: Gastroenterology

## 2022-09-15 ENCOUNTER — Encounter: Payer: Self-pay | Admitting: Gastroenterology

## 2022-09-15 VITALS — BP 110/70 | HR 55 | Ht 62.0 in | Wt 240.0 lb

## 2022-09-15 DIAGNOSIS — K219 Gastro-esophageal reflux disease without esophagitis: Secondary | ICD-10-CM | POA: Diagnosis not present

## 2022-09-15 DIAGNOSIS — Z86010 Personal history of colonic polyps: Secondary | ICD-10-CM | POA: Diagnosis not present

## 2022-09-15 NOTE — Progress Notes (Signed)
Chief Complaint: to get established  Referring Provider:  Nicholos Johns, MD      ASSESSMENT AND PLAN;   #1. GERD.  Dysphagia has resolved after eso dil 2020.  #2. H/O polyps  Plan: -Continue omeprazole '20mg'$  po QD -Records from Dr Melina Copa regarding last EGD/colon. -FU PRN   HPI:    Judy Lucas is a 67 y.o. female  With L leg venous ulcer, HLD, HTN, morbid obesity, New [place] ILD (followed by [place] pulm), anxiety, OA, choline deficiency (can only tolerate Versed, not fentanyl), HOH, s/p cholecystectomy/appendicectomy/hysterectomy  Here to get established since Dr Melina Copa retd.  No nausea, vomiting, heartburn, regurgitation, odynophagia or dysphagia (resolved after dilatation).  No significant diarrhea or constipation.  No melena or hematochezia. No unintentional weight loss. No abdominal pain.  Occ diarrhea with lettuce/coleslaw since lap chole (over 20 years ago by Dr. Sherald Hess)    Past GI procedures:  S/P EGD with dil/colon Dr Melina Copa 2020 (records are awaited).  Repeat  colon in 5 years per patient  Colonoscopy 10/2015 ([facility]) -Colonic polyp s/p polypectomy -Predominantly sigmoid diverticulosis -Repeat in 5 years d/t FH of advanced polyps (mom at age 37)  Colonoscopy 06/2005 by Dr. Talbot Grumbling colonic diverticulosis.  Internal hemorrhoids.    SH- retd RN    Mom - had polyps- age 42s  Past Medical History:  Diagnosis Date   Abnormal CT of the chest 08/27/2019   Achilles tendinitis of left lower extremity 05/02/2022   Allergic rhinitis    Allergy    Anxiety    Chest discomfort 02/20/2019   Chest pain 01/22/2019   Choline deficiency    Can only tolerate versed for procedures   Diverticulosis    Dysphagia 01/22/2019   Heel spur, left 05/02/2022   Hyperlipidemia    Hypertension    Morbid obesity ([place]) 01/22/2019   Osteoarthritis 01/22/2019   Pharyngoesophageal dysphagia 01/22/2019   Last Assessment & Plan:  Formatting of this note might be  different from the original. Concern over dysphagia.  She has intermittent periods where she will have difficulty with swallowing.  They are very brief in nature.  She had an esophageal dilation in the past that temporarily helped.  She is on omeprazole once a day. EXAM shows normal oral cavity and oropharynx.  Neck is without adenopathy or   Pneumonia 05/2015   Renal insufficiency 01/22/2019   Sensorineural hearing loss (SNHL) of both ears 02/24/2021   Last Assessment & Plan:  Formatting of this note might be different from the original. Concern over her hearing. She has known sensorineural hearing loss bilaterally and wears hearing aids.  She recently was seen by her audiologist who noted something on testing that concerned her and was sent for evaluation.  In general, she is happy with her hearing with her hearing aids. EXAM shows normal exter   Tenosynovitis of left ankle 06/07/2022   Tenosynovitis of right ankle 06/07/2022   Tightness of heel cord, left 05/02/2022    Past Surgical History:  Procedure Laterality Date   ABDOMINAL HYSTERECTOMY     APPENDECTOMY     CHOLECYSTECTOMY     COLONOSCOPY  10/14/2015   Colonic polyp status post polypectomy. Predominantly sigmoid diverticulosis   ENDOVENOUS ABLATION SAPHENOUS VEIN W/ LASER Left 10/17/2019   endovenous laser ablation anterior accessory branch of left greater saphenous vein by Gae Gallop MD    feet surgery     WRIST SURGERY      Family History  Problem Relation Age of Onset  Hyperlipidemia Mother    Hypertension Mother    Heart disease Mother    Heart attack Mother    Skin cancer Father    Hyperlipidemia Father    Hypertension Father    Heart disease Father    Heart attack Father    Hypertension Sister    Hyperlipidemia Sister    Hyperlipidemia Brother     Social History   Tobacco Use   Smoking status: Never   Smokeless tobacco: Never  Vaping Use   Vaping Use: Never used  Substance Use Topics   Alcohol use:  Never   Drug use: Never    Current Outpatient Medications  Medication Sig Dispense Refill   aspirin EC 81 MG tablet Take 81 mg by mouth daily.     Cholecalciferol (VITAMIN D3) 50 MCG (2000 UT) capsule Take 2,000 Units by mouth daily.     fluticasone (FLONASE) 50 MCG/ACT nasal spray Place 2 sprays into both nostrils daily.     loratadine (CLARITIN) 10 MG tablet Take 1 tablet by mouth daily.     meclizine (ANTIVERT) 12.5 MG tablet Take 12.5 mg by mouth 3 (three) times daily as needed for dizziness.     montelukast (SINGULAIR) 10 MG tablet Take 10 mg by mouth at bedtime.     olmesartan (BENICAR) 20 MG tablet Take 20 mg by mouth daily.     omeprazole (PRILOSEC) 20 MG capsule Take 20 mg by mouth daily.     oxybutynin (DITROPAN-XL) 10 MG 24 hr tablet Take 10 mg by mouth daily.     simvastatin (ZOCOR) 10 MG tablet Take 10 mg by mouth daily.     spironolactone (ALDACTONE) 50 MG tablet Take 50 mg by mouth daily.     No current facility-administered medications for this visit.    Allergies  Allergen Reactions   Meperidine Shortness Of Breath   Morphine Shortness Of Breath   Yellow Dyes (Non-Tartrazine) Anaphylaxis   Clindamycin Rash    Significant erythroderma with the itching associated with clindamycin.  No swelling, wheezing, dyspnea, or airway compromise.   Thyroid Swelling   Furosemide Rash    SWELLING ALLERGY SWELLING ALLERGY    Nitrofurantoin Rash   Nitroglycerin Palpitations   Penicillin G Rash   Sulfamethoxazole Rash    Review of Systems:  Constitutional: Denies fever, chills, diaphoresis, appetite change and  has fatigue.  HEENT: Has allergies Respiratory: Denies SOB, DOE, cough, chest tightness,  and wheezing.   Cardiovascular: Denies chest pain, palpitations and leg swelling.  Genitourinary: Denies dysuria, urgency, frequency, hematuria, flank pain and difficulty urinating.  Musculoskeletal: Has myalgias, back pain, joint swelling, arthralgias and gait problem.  Skin:  No rash.  Neurological: Denies dizziness, seizures, syncope, weakness, light-headedness, numbness and headaches.  Hematological: Denies adenopathy. Easy bruising, personal or family bleeding history  Psychiatric/Behavioral: Has anxiety or depression     Physical Exam:    BP 110/70   Pulse (!) 55   Ht '5\' 2"'$  (1.575 m)   Wt 240 lb (108.9 kg)   BMI 43.90 kg/m  Wt Readings from Last 3 Encounters:  09/15/22 240 lb (108.9 kg)  09/14/22 237 lb (107.5 kg)  07/18/22 237 lb 3.2 oz (107.6 kg)   Constitutional:  Well-developed, in no acute distress. Psychiatric: Normal mood and affect. Behavior is normal. HEENT: Pupils normal.  Conjunctivae are normal. No scleral icterus. Neck supple.  Cardiovascular: Normal rate, regular rhythm. No edema Pulmonary/chest: Effort normal and breath sounds normal. No wheezing, rales or rhonchi. Abdominal: Soft, nondistended.  Nontender. Bowel sounds active throughout. There are no masses palpable. No hepatomegaly. Rectal: Deferred Neurological: Alert and oriented to person place and time. Skin: Skin is warm and dry. No rashes noted.  Data Reviewed: I have personally reviewed following labs and imaging studies  CBC:    Latest Ref Rng & Units 01/22/2019    4:43 PM  CBC  WBC 3.4 - 10.8 x10E3/uL 6.3   Hemoglobin 11.1 - 15.9 g/dL 11.1   Hematocrit 34.0 - 46.6 % 32.5   Platelets 150 - 450 x10E3/uL 213     CMP:    Latest Ref Rng & Units 01/22/2019    4:43 PM  CMP  Glucose 65 - 99 mg/dL 94   BUN 8 - 27 mg/dL 20   Creatinine 0.57 - 1.00 mg/dL 0.82   Sodium 134 - 144 mmol/L 139   Potassium 3.5 - 5.2 mmol/L 4.3   Chloride 96 - 106 mmol/L 103   CO2 20 - 29 mmol/L 18   Calcium 8.7 - 10.3 mg/dL 9.2     GFR: CrCl cannot be calculated (Patient's most recent lab result is older than the maximum 21 days allowed.). Liver Function Tests: No results for input(s): "AST", "ALT", "ALKPHOS", "BILITOT", "PROT", "ALBUMIN" in the last 168 hours. No results for  input(s): "LIPASE", "AMYLASE" in the last 168 hours. No results for input(s): "AMMONIA" in the last 168 hours. Coagulation Profile: No results for input(s): "INR", "PROTIME" in the last 168 hours. HbA1C: No results for input(s): "HGBA1C" in the last 72 hours. Lipid Profile: No results for input(s): "CHOL", "HDL", "LDLCALC", "TRIG", "CHOLHDL", "LDLDIRECT" in the last 72 hours. Thyroid Function Tests: No results for input(s): "TSH", "T4TOTAL", "FREET4", "T3FREE", "THYROIDAB" in the last 72 hours. Anemia Panel: No results for input(s): "VITAMINB12", "FOLATE", "FERRITIN", "TIBC", "IRON", "RETICCTPCT" in the last 72 hours.  No results found for this or any previous visit (from the past 240 hour(s)).    Radiology Studies: No results found.    Carmell Austria, MD 09/15/2022, 10:10 AM  Cc: Nicholos Johns, MD

## 2022-09-15 NOTE — Patient Instructions (Addendum)
_______________________________________________________  If your blood pressure at your visit was 140/90 or greater, please contact your primary care physician to follow up on this.  _______________________________________________________  If you are age 68 or older, your body mass index should be between 23-30. Your Body mass index is 43.9 kg/m. If this is out of the aforementioned range listed, please consider follow up with your Primary Care Provider.  If you are age 85 or younger, your body mass index should be between 19-25. Your Body mass index is 43.9 kg/m. If this is out of the aformentioned range listed, please consider follow up with your Primary Care Provider.   ________________________________________________________  The [place] [facility] providers would like to encourage you to use [facility] to communicate with providers for non-urgent requests or questions.  Due to long hold times on the telephone, sending your provider a message by [facility] may be a faster and more efficient way to get a response.  Please allow 48 business hours for a response.  Please remember that this is for non-urgent requests.  _______________________________________________________  Continue omeprazole  We have requested records from Dr Melina Copa.  Please call with any questions or concerns.  Thank you,  Dr. Jackquline Denmark

## 2022-09-16 ENCOUNTER — Ambulatory Visit: Payer: Medicare HMO | Admitting: Gastroenterology

## 2022-09-20 ENCOUNTER — Other Ambulatory Visit: Payer: Self-pay | Admitting: *Deleted

## 2022-09-20 ENCOUNTER — Ambulatory Visit (HOSPITAL_COMMUNITY)
Admission: RE | Admit: 2022-09-20 | Discharge: 2022-09-20 | Disposition: A | Discharge status: Home / Self Care | Payer: Medicare HMO | Source: Ambulatory Visit | Attending: Primary Care | Admitting: Primary Care

## 2022-09-20 DIAGNOSIS — I700 Atherosclerosis of aorta: Secondary | ICD-10-CM | POA: Diagnosis not present

## 2022-09-20 DIAGNOSIS — R918 Other nonspecific abnormal finding of lung field: Secondary | ICD-10-CM | POA: Diagnosis not present

## 2022-09-20 DIAGNOSIS — R911 Solitary pulmonary nodule: Secondary | ICD-10-CM | POA: Diagnosis not present

## 2022-09-20 DIAGNOSIS — I83023 Varicose veins of left lower extremity with ulcer of ankle: Secondary | ICD-10-CM

## 2022-09-20 DIAGNOSIS — J849 Interstitial pulmonary disease, unspecified: Secondary | ICD-10-CM | POA: Diagnosis not present

## 2022-09-20 DIAGNOSIS — I517 Cardiomegaly: Secondary | ICD-10-CM | POA: Diagnosis not present

## 2022-09-21 ENCOUNTER — Other Ambulatory Visit: Payer: Self-pay

## 2022-09-21 DIAGNOSIS — R0609 Other forms of dyspnea: Secondary | ICD-10-CM

## 2022-09-21 DIAGNOSIS — R911 Solitary pulmonary nodule: Secondary | ICD-10-CM

## 2022-09-22 ENCOUNTER — Ambulatory Visit (INDEPENDENT_AMBULATORY_CARE_PROVIDER_SITE_OTHER): Payer: Medicare HMO | Admitting: Internal Medicine

## 2022-09-22 ENCOUNTER — Ambulatory Visit: Payer: Medicare HMO | Admitting: Internal Medicine

## 2022-09-22 ENCOUNTER — Encounter: Payer: Self-pay | Admitting: Internal Medicine

## 2022-09-22 VITALS — BP 128/80 | HR 54 | Temp 98.0°F | Ht 63.0 in | Wt 240.4 lb

## 2022-09-22 DIAGNOSIS — R9389 Abnormal findings on diagnostic imaging of other specified body structures: Secondary | ICD-10-CM

## 2022-09-22 DIAGNOSIS — R768 Other specified abnormal immunological findings in serum: Secondary | ICD-10-CM

## 2022-09-22 DIAGNOSIS — Z8701 Personal history of pneumonia (recurrent): Secondary | ICD-10-CM

## 2022-09-22 DIAGNOSIS — Z8616 Personal history of COVID-19: Secondary | ICD-10-CM | POA: Diagnosis not present

## 2022-09-22 DIAGNOSIS — R911 Solitary pulmonary nodule: Secondary | ICD-10-CM

## 2022-09-22 DIAGNOSIS — R0609 Other forms of dyspnea: Secondary | ICD-10-CM

## 2022-09-22 LAB — PULMONARY FUNCTION TEST
DL/VA % pred: 111 %
DL/VA: 4.66 ml/min/mmHg/L
DLCO cor % pred: 91 %
DLCO cor: 17.3 ml/min/mmHg
DLCO unc % pred: 91 %
DLCO unc: 17.3 ml/min/mmHg
FEF 25-75 Pre: 2.44 L/sec
FEF2575-%Pred-Pre: 126 %
FEV1-%Pred-Pre: 84 %
FEV1-Pre: 1.9 L
FEV1FVC-%Pred-Pre: 114 %
FEV6-%Pred-Pre: 75 %
FEV6-Pre: 2.12 L
FEV6FVC-%Pred-Pre: 104 %
FVC-%Pred-Pre: 73 %
FVC-Pre: 2.17 L
Pre FEV1/FVC ratio: 87 %
Pre FEV6/FVC Ratio: 100 %

## 2022-09-22 NOTE — Addendum Note (Signed)
Addended by: Alvin Critchley on: 09/22/2022 03:36 PM   Modules accepted: Orders

## 2022-09-22 NOTE — Progress Notes (Addendum)
OV 09/29/2020  Subjective:  Patient ID: Judy Lucas, female , DOB: October 13, 1955 , age 67 y.o. , MRN: [id number] , ADDRESS: [address] [place] [address] PCP Nicholos Johns, MD Patient Care Team: Nicholos Johns, MD as PCP - General (Internal Medicine)  This Provider for this visit: Treatment Team:  Attending Provider: Brand Males, MD    09/29/2020 -   Chief Complaint  Patient presents with   Follow-up    Doing well  .   ICD-10-CM   1. Solitary pulmonary nodule  R91.1   2. Abnormal CT of the chest  R93.89   3. Personal history of COVID-19  Z86.16   4. History of community acquired pneumonia  Z87.01      HPI Judy Lucas 67 y.o. -follow-up for the above issues.  She was a patient of Dr. Carlis Abbott.  Dr. Carlis Abbott is now in the ICU rotation service.  Patient is being transferred to Dr. Chase Caller.  She is solitary pulmonary nodule 15 mm in the right costophrenic angle.  She had follow-up 1 year scan in December 2021 and it is stable.  She has early interstitial lung abnormalities in the lung bases.  According to the radiologist it is worse.  According to the patient she is asymptomatic without any shortness of breath or cough wheezing orthopnea proximal nocturnal dyspnea.  She denies any acid reflux.  She denies any tobacco smoking.  She is a retired Sports coach and former Marine scientist as well.  There is no mold or mildew in the house.  No feather pillow.  She has a maple tree allergy that seasonal but otherwise she is fine.  She gives a history of community-acquired pneumonia and hospitalization 15 years ago.  Also COVID-19 in February 2020 and repeat in August/September 2021 for which she was not hospitalized.  She believes the scar tissue might be related to that.    CT Chest data  Narrative & Impression  CLINICAL DATA:  Pulmonary nodule, follow-up examination   EXAM: CT CHEST WITHOUT CONTRAST   TECHNIQUE: Multidetector CT imaging of the chest was performed following  the standard protocol without IV contrast.   COMPARISON:  07/19/2019, 06/18/2015   FINDINGS: Cardiovascular: No significant coronary artery calcification. Global cardiac size within normal limits. No pericardial effusion. Central pulmonary arteries are of normal caliber. Mild atherosclerotic calcifications seen within the thoracic aorta. No aortic aneurysm.   Mediastinum/Nodes: Visualized thyroid unremarkable. No pathologic thoracic adenopathy. Esophagus unremarkable.   Lungs/Pleura: Ovoid nodule seen within the right costophrenic angle at axial image # 113 is stable measuring 7 mm x 15 mm (average 11 mm). Focal nodule within the focus of parenchymal scarring within the right lower lobe, previously seen on axial image # 78, has resolved. 3 mm nodule within the left upper lobe, axial image # 27, is stable. Subpleural ground-glass pulmonary infiltrate with associated interlobular septal thickening at the lung bases bilaterally has progressed slightly since prior examination, likely reflecting progressive fibrotic change. No pneumothorax or pleural effusion. Central airways are widely patent.   Upper Abdomen: Cholecystectomy has been performed. No acute abnormality.   Musculoskeletal: No acute bone abnormality.   IMPRESSION: Stable dominant pulmonary nodule within the right lower lobe. Continued imaging follow-up is recommended in 1 year to document continued stability. This recommendation follows the consensus statement: Guidelines for Management of Incidental Pulmonary Nodules Detected on CT Images: From the Fleischner Society 2017; Radiology 2017; 284:228-243.   Mild interval progression of probable bibasilar pulmonary fibrotic change.  Aortic Atherosclerosis (ICD10-I70.0).     Electronically Signed   By: Fidela Salisbury MD   On: 07/17/2020 04:26          OV 07/30/2021  Subjective:  Patient ID: Judy Lucas, female , DOB: 25-Dec-1955 , age 7 y.o. , MRN:  [id number] , ADDRESS: [address] [place] [address] PCP Nicholos Johns, MD Patient Care Team: Nicholos Johns, MD as PCP - General (Internal Medicine)  This Provider for this visit: Treatment Team:  Attending Provider: Brand Males, MD    07/30/2021 -   Chief Complaint  Patient presents with   Follow-up    Pt is here to discuss results of recent CT.  Pt states she has been doing okay since last visit and denies any complaints.   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 -last seen earlier this year.  Since then stable no interim COVID.  No interim issues.  Early morning she has occasional dry cough from what she thinks is allergies.  No shortness of breath with exertion.  Sometimes she feels muscle pull on the back of her chest but otherwise no issues.  This is baseline and chronic.  She had high-resolution CT scan of the chest that shows right lower lobe nodule is stable and radiologist does deem this is benign.  However they are concerned about ILD changes.  I did visualize this and agree with them.  They are saying it is worst in 2016 but stable in the last 1 year.  I did share this with the patient.  Did explain to her that some of the ILD's could get worse.  She is interested in getting a work-up done and following physician recommendation on this.  She is somewhat reassured that the ILD is deemed stable in the last 1 year on CT scan.  Of note her husband is also here with her today.    CT Chest data  CT Chest High Resolution  Result Date: 07/28/2021 CLINICAL DATA:  Pulmonary nodule, possible pulmonary fibrosis, evaluate for progression EXAM: CT CHEST WITHOUT CONTRAST TECHNIQUE: Multidetector CT imaging of the chest was performed  following the standard protocol without intravenous contrast. High resolution imaging of the lungs, as well as inspiratory and expiratory imaging, was performed. COMPARISON:  07/16/2020, 07/19/2019, 04/25/2019, 06/18/2015 FINDINGS: Cardiovascular: Scattered aortic atherosclerosis. Cardiomegaly. No pericardial effusion. Mediastinum/Nodes: No enlarged mediastinal, hilar, or axillary lymph nodes. Thyroid gland, trachea, and esophagus demonstrate no significant findings. Lungs/Pleura: Mild pulmonary fibrosis in a pattern with apical to basal gradient, featuring irregular peripheral interstitial opacity and septal thickening without evidence of bronchiolectasis or honeycombing. Fibrosis is not significantly changed compared to immediate prior examination. Mild lobular air trapping on expiratory phase imaging. Stable, definitively benign nodule of the right lung base measuring 1.4 x 0.9 cm (series 5, image 230). No pleural effusion or pneumothorax. Upper Abdomen: No acute abnormality.  Status post cholecystectomy. Musculoskeletal: No chest wall mass or suspicious bone lesions identified. IMPRESSION: 1. Mild pulmonary fibrosis in a pattern with apical to basal gradient, featuring irregular peripheral interstitial opacity and septal thickening without evidence of bronchiolectasis or honeycombing. Fibrotic findings are not significantly changed compared to immediate prior examination although clearly worsened over time dating back to 06/18/2015. Strictly characterized, findings are (early) indeterminate for UIP per consensus guidelines, although  progression over time is concerning for UIP: Diagnosis of Idiopathic Pulmonary Fibrosis: An Official ATS/ERS/JRS/ALAT Clinical Practice Guideline. [place], Iss 5, [phone], Apr 15 2017. 2. Stable, definitively benign nodule of the right lung base. No further follow-up is specifically required for this benign nodule. 3. Cardiomegaly. Aortic Atherosclerosis  (ICD10-I70.0). Electronically Signed   By: Delanna Ahmadi M.D.   On: 07/28/2021 14:49      OV 09/30/2021  Subjective:  Patient ID: Judy Lucas, female , DOB: 06-30-1956 , age 19 y.o. , MRN: [id number] , ADDRESS: [address] [place] [address] PCP Nicholos Johns, MD Patient Care Team: Nicholos Johns, MD as PCP - General (Internal Medicine)  This Provider for this visit: Treatment Team:  Attending Provider: Brand Males, MD    09/30/2021 -   Chief Complaint  Patient presents with   Follow-up    PFT performed today.  Pt states she has been doing okay since last visit and denies any complaints.     HPI Judy Lucas 67 y.o. -returns for follow-up.  She is doing well.  According to her husband she is wheezing less and is less short of breath now.  She had pulmonary function testing that shows an improvement compared to 1 year ago and it is actually normal now.  In addition a walking desaturation test is normal.  She had autoimmune profile and this shows RNP antibody positive and p-ANCA positive.  She tells me that she has a chronic history of allergies and also some kind of enzyme deficiency and so she think she has autoimmune issues but it sounds like it is more like maple tree allergies she takes Kenalog shots earlier in the year.  She is also allergic to various food items.  I did share with her the autoimmune antibody results.  She prefers a more expectant approach and seeing a rheumatologist.  Also given her improvement in pulmonary function test and normal walking desaturation test and absence of crackles on lung exam today we took a shared decision making approach to continue surveillance as opposed to going getting a lung biopsy or starting empiric treatment.  Specifically her ILD question at was done in detail and the details are below here  [facility] Comprehensive ILD Questionnaire  Symptoms:  Past Medical History :  GERD + Allergies Hx of ppna Hx of  cpovid   ROS:  Arthralgia Occasionally has sore throat and takes omeprazole Has dry eyes Has allergies and rash  FAMILY HISTORY of LUNG DISEASE:  Negative*  PERSONAL EXPOSURE HISTORY:  No smoking no vaping.  No marijuana use no cocaine use  HOME  EXPOSURE and HOBBY DETAILS :  [place].  Age of the home is 47-1/2 years.  She has lived there since then.  Detail organic antigen exposure history in the house is negative  OCCUPATIONAL HISTORY (122 questions) : -Did some tobacco growing as a young person.  Has done some laboratory work.  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 <14 IU/mL <14 <14  Atypical P-ANCA titer <1:20 titer  1:160 (H)  SSA (Ro) (ENA) Antibody, IgG <1.0 NEG AI <1.0 NEG <1.0 NEG  SSB (La) (ENA) Antibody, IgG <1.0 NEG AI <1.0 NEG <1.0 NEG  Scleroderma (Scl-70) (ENA) Antibody, IgG <1.0 NEG AI  <1.0 NEG  (H): Data is abnormally high   Latest Reference Range & Units 07/30/21 11:06  A.Fumigatus #1 Abs Negative  Negative  Micropolyspora faeni, IgG Negative  Negative  Thermoactinomyces vulgaris, IgG Negative  Negative  A. Pullulans Abs Negative  Negative  Thermoact. Saccharii Negative  Negative  Pigeon Serum Abs Negative  Negative    OV 09/22/2022  Subjective:  Patient ID: Judy Lucas, female , DOB: September 19, 1955 , age 65 y.o. , MRN:  [id number] , ADDRESS: [address] [place] [address] PCP Nicholos Johns, MD Patient Care Team: Nicholos Johns, MD as PCP - General (Internal Medicine)  This Provider for this visit: Treatment Team:  Attending Provider: Brand Males, MD  Follow-up lung nodule right lower lobe Follow-up possible ILD" Interstitial lung abnormality  -  pneumonia 15 years ago and COVID-19 in February 2020 and also repeat COVID-19 in August/September 2021  - radiologist feels worse since 2016 but stable 20210> 2022.   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  09/22/2022 -   Chief Complaint  Patient presents with   Follow-up    Pft today.  Doing well.  No sx noted.     HPI Judy Lucas 67 y.o. -returns for follow-up of her interstitial lung abnormalities [ILA].  She continues to be asymptomatic.  Her husband is with her here today.  He is not an independent historian.  She says in the interim she is got some nonhealing left lower extremity ulcers.  She is going to see Dr. Doren Custard and vascular vein specialist.  In addition October 2023 she had clindamycin for dental infection and had reaction that put her in the ER.  It is now an allergy but respiratory wise she is stable no other emergency room visits no hospitalizations.  No other medication changes.  She had a high-resolution CT chest there are some motion artifact but her interstitial lung abnormalities according to me is very minimal and stable.  Her pulmonary function test also is stable and is range bound.  The previous nodule is not well visualized.  She is up-to-date with her vaccines.  She did ask me what she could do to prevent her from developing ILD.  We talked about acid reflux control and preventing respiratory viruses.  She has seen Dr. Lyndel Safe for this.    Shortness of Breath 0 -> 5 scale with 5 being worst (score 6 If unable to do) 09/22/2022   At rest  0 0  Simple tasks - showers, clothes change, eating, shaving 0 0  Household (dishes, doing bed, laundry) 0 0  Shopping 0 0  Walking level at own pace 0 0  Walking up Stairs 0 0  Total (30-36) Dyspnea Score 0 0      Non-dyspnea symptoms (0-> 5 scale) 09/30/2021 09/22/2022   How bad is your cough? Mild due to allergies   How bad is your fatigue 0  0  How bad is nausea 0 0  How bad is vomiting?  00 0  How bad is diarrhea? 0 0  How bad is anxiety? 0 0  How bad is depression 0 0  Any chronic pain - if so where and how bad 0 0      Simple office walk 185 feet x  3 laps goal with forehead probe 09/30/2021    O2 used ra   Number laps completed 3   Comments about pace avg   Resting Pulse Ox/HR 100% and 51/min   Final Pulse Ox/HR 99% and 86/min   Desaturated </= 88% no   Desaturated <= 3% points no   Got Tachycardic >/= 90/min no   Symptoms at end of test none   Miscellaneous comments Nomal tste     Modified Six Minute Walk - 09/22/22 1300     Type of O2 used  Room Air    Number of laps completed  3    Lap Pace Brisk    Resting Heartrate 55 bpm    Final Heartrate 78 bpm    Resting Pulse Ox 97 %    Desaturated to <= 3 points No    Desaturated to < 88% No    Became tachycardic No    Symptoms  No sx noted    comments Patient walked 3 laps with no difficulty              CT Chest data  CT Chest High Resolution  Result Date: 09/20/2022 CLINICAL DATA:  Lung nodule. EXAM: CT CHEST WITHOUT CONTRAST TECHNIQUE: Multidetector CT imaging of the chest was performed following the standard protocol without intravenous contrast. High resolution imaging of the lungs, as well as inspiratory and expiratory imaging, was performed. RADIATION DOSE REDUCTION: This exam was performed according to the departmental dose-optimization program which includes automated exposure control, adjustment of the mA and/or kV according to patient size and/or use of iterative reconstruction technique.  COMPARISON:  10/27/2021, 07/16/2020. FINDINGS: Cardiovascular: Atherosclerotic calcification of the aorta. Heart is enlarged. No pericardial effusion. Mediastinum/Nodes: No pathologically enlarged mediastinal or axillary lymph nodes. Hilar regions are difficult to definitively evaluate without IV contrast. Distal esophageal wall thickening can be seen in the setting of gastroesophageal reflux. Lungs/Pleura: Image quality is degraded by expiratory phase imaging. Subpleural ground-glass in the posterior lower lobes largely clears on prone imaging. Mild subpleural scarring in the right lower lobe. Any further assessment for subtle interstitial lung disease is hindered by expiratory phase imaging. There is mild air trapping. No pleural fluid. Airway is unremarkable. Upper Abdomen: Visualized portions of the liver, adrenal glands, right kidney, spleen, pancreas, stomach and bowel are grossly unremarkable. No upper abdominal adenopathy. Musculoskeletal: Degenerative changes in the spine. No worrisome lytic or sclerotic lesions. IMPRESSION: 1. Image quality is markedly degraded by expiratory phase imaging, limiting the evaluation for subtle subpleural fibrosis, as suggested on comparison exams. 2. Mild air trapping is indicative of small airways disease. 3.  Aortic atherosclerosis (ICD10-I70.0). Electronically Signed   By: Lorin Picket M.D.   On: 09/20/2022 15:08      PFT     Latest Ref Rng & Units 09/22/2022   12:44 PM 09/30/2021    9:52 AM 09/29/2020    8:55 AM  PFT Results  FVC-Pre L 2.17  P 2.43  2.30   FVC-Predicted Pre % 73  P 81  76   FVC-Post L   2.45   FVC-Predicted Post %   81  Pre FEV1/FVC % % 87  P 87  86   Post FEV1/FCV % %   87   FEV1-Pre L 1.90  P 2.12  1.97   FEV1-Predicted Pre % 84  P 93  85   FEV1-Post L   2.15   DLCO uncorrected ml/min/mmHg 17.30  P 21.06  18.86   DLCO UNC% % 91  P 110  98   DLCO corrected ml/min/mmHg 17.30  P 21.06  18.86   DLCO COR %Predicted % 91  P 110  98    DLVA Predicted % 111  P 127  122   TLC L   3.79   TLC % Predicted %   77   RV % Predicted %   71     P Preliminary result       has a past medical history of Abnormal CT of the chest (08/27/2019), Achilles tendinitis of left lower extremity (05/02/2022), Allergic rhinitis, Allergy, Anxiety, Chest discomfort (02/20/2019), Chest pain (01/22/2019), Choline deficiency, Diverticulosis, Dysphagia (01/22/2019), Heel spur, left (05/02/2022), Hyperlipidemia, Hypertension, Morbid obesity ([place]) (01/22/2019), Osteoarthritis (01/22/2019), Pharyngoesophageal dysphagia (01/22/2019), Pneumonia (05/2015), Renal insufficiency (01/22/2019), Sensorineural hearing loss (SNHL) of both ears (02/24/2021), Tenosynovitis of left ankle (06/07/2022), Tenosynovitis of right ankle (06/07/2022), and Tightness of heel cord, left (05/02/2022).   reports that she has never smoked. She has never used smokeless tobacco.  Past Surgical History:  Procedure Laterality Date   ABDOMINAL HYSTERECTOMY     APPENDECTOMY     CHOLECYSTECTOMY     COLONOSCOPY  10/14/2015   Colonic polyp status post polypectomy. Predominantly sigmoid diverticulosis   COLONOSCOPY  05/31/2019   Dr Melina Copa  Benign neoplasm of cecum. Repeat in 5 years   ENDOVENOUS ABLATION SAPHENOUS VEIN W/ LASER Left 10/17/2019   endovenous laser ablation anterior accessory branch of left greater saphenous vein by Gae Gallop MD    ESOPHAGOGASTRODUODENOSCOPY  05/31/2019   Dr Melina Copa. There was no esophagitis. Multiple biopsies obtained from the esophagus to evaluate for eosinophilic esophagitis. Normal Z-Line. The EG junction did not distend well and had increased spasm. A wide bore stricture or ring could have been missed due to lack of distension but there was no obvious stricture.   feet surgery     WRIST SURGERY      Allergies  Allergen Reactions   Meperidine Shortness Of Breath   Morphine Shortness Of Breath   Yellow Dyes (Non-Tartrazine) Anaphylaxis    Clindamycin Rash    Significant erythroderma with the itching associated with clindamycin.  No swelling, wheezing, dyspnea, or airway compromise.   Thyroid Swelling   Furosemide Rash    SWELLING ALLERGY SWELLING ALLERGY    Nitrofurantoin Rash   Nitroglycerin Palpitations   Penicillin G Rash   Sulfamethoxazole Rash    Immunization History  Administered Date(s) Administered   Fluad Quad(high Dose 65+) 05/27/2020   Influenza, High Dose Seasonal PF 05/27/2021   Influenza,inj,Quad PF,6-35 Mos 05/16/2019   Influenza-Unspecified 05/30/2022   PFIZER(Purple Top)SARS-COV-2 Vaccination 09/09/2019, 09/23/2019, 07/22/2020   Zoster Recombinat (Shingrix) 05/14/2018    Family History  Problem Relation Age of Onset   Hyperlipidemia Mother    Hypertension Mother    Heart disease Mother    Heart attack Mother    Skin cancer Father    Hyperlipidemia Father    Hypertension Father    Heart disease Father    Heart attack Father    Hypertension Sister    Hyperlipidemia Sister    Hyperlipidemia Brother  Current Outpatient Medications:    albuterol (VENTOLIN HFA) 108 (90 Base) MCG/ACT inhaler, Inhale 2 puffs into the lungs every 4 (four) hours as needed for wheezing or shortness of breath., Disp: , Rfl:    aspirin EC 81 MG tablet, Take 81 mg by mouth daily., Disp: , Rfl:    Cholecalciferol (VITAMIN D3) 50 MCG (2000 UT) capsule, Take 2,000 Units by mouth daily., Disp: , Rfl:    fluticasone (FLONASE) 50 MCG/ACT nasal spray, Place 2 sprays into both nostrils daily., Disp: , Rfl:    loratadine (CLARITIN) 10 MG tablet, Take 1 tablet by mouth daily., Disp: , Rfl:    meclizine (ANTIVERT) 12.5 MG tablet, Take 12.5 mg by mouth 3 (three) times daily as needed for dizziness., Disp: , Rfl:    montelukast (SINGULAIR) 10 MG tablet, Take 10 mg by mouth at bedtime., Disp: , Rfl:    olmesartan (BENICAR) 20 MG tablet, Take 20 mg by mouth daily., Disp: , Rfl:    omeprazole (PRILOSEC) 20 MG capsule, Take 20  mg by mouth daily., Disp: , Rfl:    oxybutynin (DITROPAN-XL) 10 MG 24 hr tablet, Take 10 mg by mouth daily., Disp: , Rfl:    simvastatin (ZOCOR) 10 MG tablet, Take 10 mg by mouth daily., Disp: , Rfl:    spironolactone (ALDACTONE) 50 MG tablet, Take 50 mg by mouth daily., Disp: , Rfl:       Objective:   Vitals:   09/22/22 1329  BP: 128/80  Pulse: (!) 54  Temp: 98 F (36.7 C)  TempSrc: Oral  SpO2: 96%  Weight: 240 lb 6.4 oz (109 kg)  Height: '5\' 3"'$  (1.6 m)    Estimated body mass index is 42.58 kg/m as calculated from the following:   Height as of this encounter: '5\' 3"'$  (1.6 m).   Weight as of this encounter: 240 lb 6.4 oz (109 kg).  '@WEIGHTCHANGE'$ @  [employer]   09/22/22 1329  Weight: 240 lb 6.4 oz (109 kg)     Physical Exam  General: No distress. obese Neuro: Alert and Oriented x 3. GCS 15. Speech normal Psych: Pleasant Resp:  Barrel Chest - no.  Wheeze - no, Crackles - no, No overt respiratory distress CVS: Normal heart sounds. Murmurs - no Ext: Stigmata of Connective Tissue Disease - no HEENT: Normal upper airway. PEERL +. No post nasal drip        Assessment:       ICD-10-CM   1. Abnormal CT of the chest  R93.89     2. Anti-RNP antibodies present  R76.8     3. Solitary pulmonary nodule  R91.1     4. Personal history of COVID-19  Z86.16     5. History of community acquired pneumonia  Z87.01          Plan:     Patient Instructions  Solitary pulmonary nodule  -15 mm nodule right costophrenic angle stable between December 2020 in December 2021. NO change in Dec 2022 CT. Not easily visible Feb 2024 CT  Plan - expectant followup  Abnormal CT of the chest -early interstitial lung abnormalities December 2021 and dec 2022 and feb 2024 Personal history of COVID-19 History of community acquired pneumonia remote   -There is  ver very mild scar tissue in the bottom of the lung.  This probably reflects admission for pneumonia 15 years ago and COVID-19  in February 2020 and also repeat COVID-19 in August/September 2021. We call this interstitial lung abnormality  -  PFT improved fev 2022 -> feb 2023 -> stable Feb 2024 - CT scan stable feb 2024 - Curently asymptomatic   Plan - surveillance approach  - avoiding biopsy or empiric treatment approach -spiro/dlco - feb/march 2025 (1 year)    RNP antibiody positive P-ANCA positive  Plan   - expectant approach per shared decision making  Follow-up - return in 1 year  for  15 min visit but after PFT testing  - simple walk test at followup  ( Level 05 visit: Estb 40-54 min   in  visit type: on-site physical face to visit  in total care time and counseling or/and coordination of care by this undersigned MD - Dr Brand Males. This includes one or more of the following on this same day 09/22/2022: pre-charting, chart review, note writing, documentation discussion of test results, diagnostic or treatment recommendations, prognosis, risks and benefits of management options, instructions, education, compliance or risk-factor reduction. It excludes time spent by the [place] or office staff in the care of the patient. Actual time 31 min)   SIGNATURE    Dr. Brand Males, M.D., F.C.C.P,  Pulmonary and Critical Care Medicine Staff Physician, [place] Director - Interstitial Lung Disease  Program  Pulmonary [place] [facility] [place], [place], [address]  Pager: [phone], If no answer or between  15:00h - 7:00h: call [phone] Telephone: [phone]  1:54 PM 09/22/2022

## 2022-09-22 NOTE — Patient Instructions (Addendum)
Solitary pulmonary nodule  -15 mm nodule right costophrenic angle stable between December 2020 in December 2021. NO change in Dec 2022 CT. Not easily visible Feb 2024 CT  Plan - expectant followup  Abnormal CT of the chest -early interstitial lung abnormalities December 2021 and dec 2022 and feb 2024 Personal history of COVID-19 History of community acquired pneumonia remote   -There is  ver very mild scar tissue in the bottom of the lung.  This probably reflects admission for pneumonia 15 years ago and COVID-19 in February 2020 and also repeat COVID-19 in August/September 2021. We call this interstitial lung abnormality  - PFT improved fev 2022 -> feb 2023 -> stable Feb 2024 - CT scan stable feb 2024 - Curently asymptomatic   Plan - surveillance approach  - avoiding biopsy or empiric treatment approach -spiro/dlco - feb/march 2025 (1 year)    RNP antibiody positive P-ANCA positive  Plan   - expectant approach per shared decision making  Follow-up - return in 1 year  for  15 min visit but after PFT testing  - simple walk test at followup

## 2022-09-22 NOTE — Patient Instructions (Signed)
Spirometry and DLCO Performed Today.  

## 2022-09-22 NOTE — Progress Notes (Signed)
Spirometry and DLCO Performed Today.  

## 2022-09-27 ENCOUNTER — Other Ambulatory Visit: Payer: Self-pay | Admitting: *Deleted

## 2022-09-27 MED ORDER — DIAZEPAM 5 MG PO TABS: ORAL_TABLET | ORAL | 0 refills | Status: AC

## 2022-10-05 ENCOUNTER — Ambulatory Visit (INDEPENDENT_AMBULATORY_CARE_PROVIDER_SITE_OTHER): Payer: Medicare HMO | Admitting: Vascular Surgery

## 2022-10-05 ENCOUNTER — Encounter: Payer: Self-pay | Admitting: Vascular Surgery

## 2022-10-05 VITALS — BP 134/71 | HR 52 | Temp 97.9°F | Resp 18 | Ht 62.0 in | Wt 240.0 lb

## 2022-10-05 DIAGNOSIS — I872 Venous insufficiency (chronic) (peripheral): Secondary | ICD-10-CM

## 2022-10-05 HISTORY — PX: ENDOVENOUS ABLATION SAPHENOUS VEIN W/ LASER: SUR449

## 2022-10-05 NOTE — Progress Notes (Signed)
     Laser Ablation Procedure    Date: 10/05/2022   Judy Lucas DOB:05-14-1956  Consent signed: Yes      Surgeon: Gae Gallop MD   Procedure: Laser Ablation: left Small Saphenous Vein  BP 134/71 (BP Location: Left Arm, Patient Position: Sitting, Cuff Size: Large)   Pulse (!) 52   Temp 97.9 F (36.6 C) (Temporal)   Resp 18   Ht 5' 2"$  (1.575 m)   Wt 240 lb (108.9 kg)   SpO2 99%   BMI 43.90 kg/m   Tumescent Anesthesia: 300 cc 0.9% NaCl with 50 cc Lidocaine HCL 1%  and 15 cc 8.4% NaHCO3  Local Anesthesia: 3 cc Lidocaine HCL and NaHCO3 (ratio 2:1)  7 watts continuous mode     Total energy: 726 Joules    Total time: 103 seconds Treatment Length  14 cm   Laser Fiber Ref. #   [id number]    Lot #  [id number]    Patient tolerated procedure well  Notes:  All staff members wore facial masks. Judy Lucas took Valium 5 mg on 10-05-2022 at 8:00 AM.    Description of Procedure:  After marking the course of the secondary varicosities, the patient was placed on the operating table in the prone position, and the left leg was prepped and draped in sterile fashion.   Local anesthetic was administered and under ultrasound guidance the saphenous vein was accessed with a micro needle and guide wire; then the mirco puncture sheath was placed.  A guide wire was inserted saphenopopliteal junction , followed by a 5 french sheath.  The position of the sheath and then the laser fiber below the junction was confirmed using the ultrasound.  Tumescent anesthesia was administered along the course of the saphenous vein using ultrasound guidance. The patient was placed in Trendelenburg position and protective laser glasses were placed on patient and staff, and the laser was fired at 7 watts continuous mode for a total of 726 joules.       Steri strip was applied to the IV insertion site and ABD pads and thigh high compression stockings were applied.  Ace wrap bandages were applied over the left thigh  and at the top of the saphenopopliteal junction. Blood loss was less than 15 cc.  Discharge instructions reviewed with patient and hardcopy of discharge instructions given to patient to take home. The patient was taken out of the operating room via wheelchair to her car having tolerated the procedure well.

## 2022-10-05 NOTE — Progress Notes (Signed)
   Patient name: Trinaty Bressan MRN: [id number] DOB: [birth year] Sex: female  REASON FOR VISIT: For laser ablation of the left small saphenous vein  HPI: Mahitha Armenti is a 67 y.o. female who I last saw on 09/14/2022.  She has a venous ulcer of the left leg.  She has C6 venous disease.  She failed conservative measures and I felt she was a good candidate for laser ablation of the left small saphenous vein.  She had a vein of Giacomini with no connection to the popliteal vein.  Current Outpatient Medications  Medication Sig Dispense Refill   albuterol (VENTOLIN HFA) 108 (90 Base) MCG/ACT inhaler Inhale 2 puffs into the lungs every 4 (four) hours as needed for wheezing or shortness of breath.     aspirin EC 81 MG tablet Take 81 mg by mouth daily.     Cholecalciferol (VITAMIN D3) 50 MCG (2000 UT) capsule Take 2,000 Units by mouth daily.     diazepam (VALIUM) 5 MG tablet Take 1 tablet 30 minutes prior to leaving house on day of office surgery. 1 tablet 0   fluticasone (FLONASE) 50 MCG/ACT nasal spray Place 2 sprays into both nostrils daily.     loratadine (CLARITIN) 10 MG tablet Take 1 tablet by mouth daily.     meclizine (ANTIVERT) 12.5 MG tablet Take 12.5 mg by mouth 3 (three) times daily as needed for dizziness.     montelukast (SINGULAIR) 10 MG tablet Take 10 mg by mouth at bedtime.     olmesartan (BENICAR) 20 MG tablet Take 20 mg by mouth daily.     omeprazole (PRILOSEC) 20 MG capsule Take 20 mg by mouth daily.     oxybutynin (DITROPAN-XL) 10 MG 24 hr tablet Take 10 mg by mouth daily.     simvastatin (ZOCOR) 10 MG tablet Take 10 mg by mouth daily.     spironolactone (ALDACTONE) 50 MG tablet Take 50 mg by mouth daily.     No current facility-administered medications for this visit.    PHYSICAL EXAM: Vitals:   10/05/22 0825  BP: 134/71  Pulse: (!) 52  Resp: 18  Temp: 97.9 F (36.6 C)  TempSrc: Temporal  SpO2: 99%  Weight: 240 lb (108.9 kg)  Height: 5' 2"$  (1.575 m)    PROCEDURE:  Laser ablation left small saphenous vein  TECHNIQUE: The patient was taken to the exam room and placed prone.  I looked at the small saphenous vein myself and I felt like to cannulate this in the mid calf.  The left leg was prepped and draped in the usual sterile fashion.  Under ultrasound guidance, after the skin was anesthetized, I cannulated the left small saphenous vein with a micropuncture needle and a micropuncture sheath was introduced over a wire.  I then advanced the J-wire to above the popliteal crease where the vein of Giacomini branched.  Again there was no connection to the deep vein.  The 45 cm sheath was advanced over the wire and the wire and dilator removed.  The laser fiber was positioned at the end of the sheath and the sheath retracted.  Laser ablation was performed of left small saphenous vein over a length of 14 cm.  50 J/cm was used at 52 W.  The patient tolerated the procedure well.  A pressure dressing was applied.  Patient tolerated the procedure well and will return in 1 week for a follow-up duplex.  Deitra Mayo [facility] [phone]

## 2022-10-12 ENCOUNTER — Ambulatory Visit (HOSPITAL_COMMUNITY)
Admission: RE | Admit: 2022-10-12 | Discharge: 2022-10-12 | Disposition: A | Discharge status: Home / Self Care | Payer: Medicare HMO | Source: Ambulatory Visit | Attending: Vascular Surgery | Admitting: Vascular Surgery

## 2022-10-12 ENCOUNTER — Ambulatory Visit (INDEPENDENT_AMBULATORY_CARE_PROVIDER_SITE_OTHER): Payer: Medicare HMO | Admitting: Vascular Surgery

## 2022-10-12 ENCOUNTER — Encounter: Payer: Self-pay | Admitting: Vascular Surgery

## 2022-10-12 VITALS — BP 147/79 | HR 58 | Temp 97.3°F | Resp 14

## 2022-10-12 DIAGNOSIS — I872 Venous insufficiency (chronic) (peripheral): Secondary | ICD-10-CM | POA: Diagnosis not present

## 2022-10-12 DIAGNOSIS — L97329 Non-pressure chronic ulcer of left ankle with unspecified severity: Secondary | ICD-10-CM | POA: Diagnosis not present

## 2022-10-12 DIAGNOSIS — I83023 Varicose veins of left lower extremity with ulcer of ankle: Secondary | ICD-10-CM | POA: Insufficient documentation

## 2022-10-12 NOTE — Progress Notes (Signed)
Patient name: Judy Lucas MRN: [id number] DOB: 10/25/1955 Sex: female  REASON FOR VISIT: For follow-up after laser ablation of the left small saphenous vein.  HPI: Judy Lucas is a 67 y.o. female who had a venous ulcer on her left leg.  She has C6 venous disease.  She failed conservative treatment and was felt to be a candidate for laser ablation of the left small saphenous vein.  She had a vein of Giacomini with no connection to the popliteal vein.  She underwent laser ablation of the left small saphenous vein on 10/05/2022.  She comes in for a 1 week follow-up visit.  Today, the patient has no specific complaints.  She is doing well.  She has had minimal tenderness.  She denies chest pain or shortness of breath.  Current Outpatient Medications  Medication Sig Dispense Refill   albuterol (VENTOLIN HFA) 108 (90 Base) MCG/ACT inhaler Inhale 2 puffs into the lungs every 4 (four) hours as needed for wheezing or shortness of breath.     aspirin EC 81 MG tablet Take 81 mg by mouth daily.     Cholecalciferol (VITAMIN D3) 50 MCG (2000 UT) capsule Take 2,000 Units by mouth daily.     diazepam (VALIUM) 5 MG tablet Take 1 tablet 30 minutes prior to leaving house on day of office surgery. 1 tablet 0   fluticasone (FLONASE) 50 MCG/ACT nasal spray Place 2 sprays into both nostrils daily.     loratadine (CLARITIN) 10 MG tablet Take 1 tablet by mouth daily.     meclizine (ANTIVERT) 12.5 MG tablet Take 12.5 mg by mouth 3 (three) times daily as needed for dizziness.     montelukast (SINGULAIR) 10 MG tablet Take 10 mg by mouth at bedtime.     olmesartan (BENICAR) 20 MG tablet Take 20 mg by mouth daily.     omeprazole (PRILOSEC) 20 MG capsule Take 20 mg by mouth daily.     oxybutynin (DITROPAN-XL) 10 MG 24 hr tablet Take 10 mg by mouth daily.     simvastatin (ZOCOR) 10 MG tablet Take 10 mg by mouth daily.     spironolactone (ALDACTONE) 50 MG tablet Take 50 mg by mouth daily.     No current  facility-administered medications for this visit.   REVIEW OF SYSTEMS: Valu.Nieves ] denotes positive finding; [  ] denotes negative finding  CARDIOVASCULAR:  '[ ]'$  chest pain   '[ ]'$  dyspnea on exertion  '[ ]'$  leg swelling  CONSTITUTIONAL:  '[ ]'$  fever   '[ ]'$  chills  PHYSICAL EXAM: Vitals:   10/12/22 1023  BP: (!) 147/79  Pulse: (!) 58  Resp: 14  Temp: (!) 97.3 F (36.3 C)  TempSrc: Temporal  SpO2: 98%   GENERAL: The patient is a well-nourished female, in no acute distress. The vital signs are documented above. CARDIOVASCULAR: There is a regular rate and rhythm. PULMONARY: There is good air exchange bilaterally without wheezing or rales. VASCULAR: She has no significant leg swelling or leg bruising.  DATA:  VENOUS DUPLEX: I have independently interpreted her venous duplex scan today.  This was of the left lower extremity.  There was no evidence of DVT.  The left small saphenous vein is successfully closed to the mid calf.  MEDICAL ISSUES:  S/P LASER ABLATION LEFT SMALL SAPHENOUS VEIN: Patient is doing well status post laser ablation of the left small saphenous vein.  She has 1 more week with her compression stockings.  I encouraged her to elevate her legs  daily.  Discussed importance of exercise.  I will be happy to see her back at any time if any new vascular issues arise.  Deitra Mayo [facility] [phone]

## 2022-11-17 DIAGNOSIS — M19071 Primary osteoarthritis, right ankle and foot: Secondary | ICD-10-CM | POA: Diagnosis not present

## 2022-11-17 DIAGNOSIS — M19072 Primary osteoarthritis, left ankle and foot: Secondary | ICD-10-CM | POA: Diagnosis not present

## 2022-12-05 DIAGNOSIS — Z1231 Encounter for screening mammogram for malignant neoplasm of breast: Secondary | ICD-10-CM | POA: Diagnosis not present

## 2022-12-08 DIAGNOSIS — Z8739 Personal history of other diseases of the musculoskeletal system and connective tissue: Secondary | ICD-10-CM | POA: Diagnosis not present

## 2022-12-08 DIAGNOSIS — Z6841 Body Mass Index (BMI) 40.0 and over, adult: Secondary | ICD-10-CM | POA: Diagnosis not present

## 2022-12-08 DIAGNOSIS — J309 Allergic rhinitis, unspecified: Secondary | ICD-10-CM | POA: Diagnosis not present

## 2022-12-08 DIAGNOSIS — E785 Hyperlipidemia, unspecified: Secondary | ICD-10-CM | POA: Diagnosis not present

## 2022-12-08 DIAGNOSIS — I82812 Embolism and thrombosis of superficial veins of left lower extremities: Secondary | ICD-10-CM | POA: Diagnosis not present

## 2022-12-08 DIAGNOSIS — E559 Vitamin D deficiency, unspecified: Secondary | ICD-10-CM | POA: Diagnosis not present

## 2022-12-08 DIAGNOSIS — Z79899 Other long term (current) drug therapy: Secondary | ICD-10-CM | POA: Diagnosis not present

## 2022-12-08 DIAGNOSIS — E119 Type 2 diabetes mellitus without complications: Secondary | ICD-10-CM | POA: Diagnosis not present

## 2022-12-08 DIAGNOSIS — R600 Localized edema: Secondary | ICD-10-CM | POA: Diagnosis not present

## 2022-12-08 DIAGNOSIS — I10 Essential (primary) hypertension: Secondary | ICD-10-CM | POA: Diagnosis not present

## 2022-12-27 DIAGNOSIS — H524 Presbyopia: Secondary | ICD-10-CM | POA: Diagnosis not present

## 2023-01-30 DIAGNOSIS — M19071 Primary osteoarthritis, right ankle and foot: Secondary | ICD-10-CM | POA: Diagnosis not present

## 2023-01-30 DIAGNOSIS — M19072 Primary osteoarthritis, left ankle and foot: Secondary | ICD-10-CM | POA: Diagnosis not present

## 2023-03-08 DIAGNOSIS — Z0000 Encounter for general adult medical examination without abnormal findings: Secondary | ICD-10-CM | POA: Diagnosis not present

## 2023-03-08 DIAGNOSIS — Z9181 History of falling: Secondary | ICD-10-CM | POA: Diagnosis not present

## 2023-03-21 DIAGNOSIS — I83813 Varicose veins of bilateral lower extremities with pain: Secondary | ICD-10-CM | POA: Diagnosis not present

## 2023-03-21 DIAGNOSIS — Z8041 Family history of malignant neoplasm of ovary: Secondary | ICD-10-CM | POA: Diagnosis not present

## 2023-03-21 DIAGNOSIS — E1169 Type 2 diabetes mellitus with other specified complication: Secondary | ICD-10-CM | POA: Diagnosis not present

## 2023-03-21 DIAGNOSIS — Z8739 Personal history of other diseases of the musculoskeletal system and connective tissue: Secondary | ICD-10-CM | POA: Diagnosis not present

## 2023-03-21 DIAGNOSIS — I10 Essential (primary) hypertension: Secondary | ICD-10-CM | POA: Diagnosis not present

## 2023-03-21 DIAGNOSIS — E559 Vitamin D deficiency, unspecified: Secondary | ICD-10-CM | POA: Diagnosis not present

## 2023-03-21 DIAGNOSIS — K219 Gastro-esophageal reflux disease without esophagitis: Secondary | ICD-10-CM | POA: Diagnosis not present

## 2023-03-21 DIAGNOSIS — Z6841 Body Mass Index (BMI) 40.0 and over, adult: Secondary | ICD-10-CM | POA: Diagnosis not present

## 2023-03-21 DIAGNOSIS — R600 Localized edema: Secondary | ICD-10-CM | POA: Diagnosis not present

## 2023-03-30 DIAGNOSIS — N959 Unspecified menopausal and perimenopausal disorder: Secondary | ICD-10-CM | POA: Diagnosis not present

## 2023-03-30 DIAGNOSIS — Z0189 Encounter for other specified special examinations: Secondary | ICD-10-CM | POA: Diagnosis not present

## 2023-04-13 DIAGNOSIS — E538 Deficiency of other specified B group vitamins: Secondary | ICD-10-CM | POA: Insufficient documentation

## 2023-04-13 DIAGNOSIS — K5790 Diverticulosis of intestine, part unspecified, without perforation or abscess without bleeding: Secondary | ICD-10-CM | POA: Insufficient documentation

## 2023-04-13 DIAGNOSIS — F419 Anxiety disorder, unspecified: Secondary | ICD-10-CM | POA: Insufficient documentation

## 2023-04-18 ENCOUNTER — Encounter: Payer: Self-pay | Admitting: Cardiology

## 2023-04-18 ENCOUNTER — Ambulatory Visit: Payer: Medicare HMO | Attending: Cardiology | Admitting: Cardiology

## 2023-04-18 VITALS — BP 130/66 | HR 56 | Ht 63.0 in | Wt 241.8 lb

## 2023-04-18 DIAGNOSIS — I10 Essential (primary) hypertension: Secondary | ICD-10-CM

## 2023-04-18 DIAGNOSIS — E782 Mixed hyperlipidemia: Secondary | ICD-10-CM | POA: Diagnosis not present

## 2023-04-18 NOTE — Progress Notes (Signed)
Cardiology Office Note:    Date:  04/18/2023   ID:  Judy Lucas, DOB Jul 29, 1956, MRN [id number]  PCP:  Lucianne Lei, MD  Cardiologist:  Garwin Brothers, MD   Referring MD: Lucianne Lei, MD    ASSESSMENT:    1. Primary hypertension   2. Mixed hyperlipidemia   3. Morbid obesity (HCC)    PLAN:    In order of problems listed above:  Primary prevention stressed with the patient.  Importance of compliance with diet medication stressed and patient verbalized standing.  She was advised to walk at least half an hour a day 5 days a week and she promises to do so. Essential hypertension: Blood pressure stable and diet was emphasized.  Lifestyle modification urged. Mixed dyslipidemia: On lipid-lowering medications.  Lipids were reviewed and discussed with the patient Morbid obesity: Weight reduction stressed diet emphasized and the risks of obesity discussed with the patient and she promises to do better. Patient will be seen in follow-up appointment in 6 months or earlier if the patient has any concerns.    Medication Adjustments/Labs and Tests Ordered: Current medicines are reviewed at length with the patient today.  Concerns regarding medicines are outlined above.  No orders of the defined types were placed in this encounter.  No orders of the defined types were placed in this encounter.    No chief complaint on file.    History of Present Illness:    Judy Lucas is a 67 y.o. female.  Patient has past medical history of essential hypertension, mixed dyslipidemia and morbid obesity.  She denies any problems at this time and takes care of activities of daily living.  No chest pain orthopnea or PND.  Overall overall she leads a sedentary lifestyle.  At the time of my evaluation, the patient is alert awake oriented and in no distress.  Past Medical History:  Diagnosis Date   Abnormal CT of the chest 08/27/2019   Achilles tendinitis of left lower extremity 05/02/2022   Allergic  rhinitis    Allergy    Anxiety    Chest discomfort 02/20/2019   Chest pain 01/22/2019   Choline deficiency    Can only tolerate versed for procedures   Diverticulosis    Dysphagia 01/22/2019   Heel spur, left 05/02/2022   Hyperlipidemia    Hypertension    Morbid obesity (HCC) 01/22/2019   Osteoarthritis 01/22/2019   Pharyngoesophageal dysphagia 01/22/2019   Last Assessment & Plan:  Formatting of this note might be different from the original. Concern over dysphagia.  She has intermittent periods where she will have difficulty with swallowing.  They are very brief in nature.  She had an esophageal dilation in the past that temporarily helped.  She is on omeprazole once a day. EXAM shows normal oral cavity and oropharynx.  Neck is without adenopathy or   Pneumonia 05/2015   Renal insufficiency 01/22/2019   Sensorineural hearing loss (SNHL) of both ears 02/24/2021   Last Assessment & Plan:  Formatting of this note might be different from the original. Concern over her hearing. She has known sensorineural hearing loss bilaterally and wears hearing aids.  She recently was seen by her audiologist who noted something on testing that concerned her and was sent for evaluation.  In general, she is happy with her hearing with her hearing aids. EXAM shows normal exter   Tenosynovitis of left ankle 06/07/2022   Tenosynovitis of right ankle 06/07/2022   Tightness of heel cord, left 05/02/2022  Past Surgical History:  Procedure Laterality Date   ABDOMINAL HYSTERECTOMY     APPENDECTOMY     CHOLECYSTECTOMY     COLONOSCOPY  10/14/2015   Colonic polyp status post polypectomy. Predominantly sigmoid diverticulosis   COLONOSCOPY  05/31/2019   Dr Charm Barges  Benign neoplasm of cecum. Repeat in 5 years   ENDOVENOUS ABLATION SAPHENOUS VEIN W/ LASER Left 10/17/2019   endovenous laser ablation anterior accessory branch of left greater saphenous vein by Cari Caraway MD    ENDOVENOUS ABLATION SAPHENOUS VEIN W/  LASER Left 10/05/2022   endovenous laser ablation left small saphenous vein by Cari Caraway MD   ESOPHAGOGASTRODUODENOSCOPY  05/31/2019   Dr Charm Barges. There was no esophagitis. Multiple biopsies obtained from the esophagus to evaluate for eosinophilic esophagitis. Normal Z-Line. The EG junction did not distend well and had increased spasm. A wide bore stricture or ring could have been missed due to lack of distension but there was no obvious stricture.   feet surgery     WRIST SURGERY      Current Medications: Current Meds  Medication Sig   albuterol (VENTOLIN HFA) 108 (90 Base) MCG/ACT inhaler Inhale 2 puffs into the lungs every 4 (four) hours as needed for wheezing or shortness of breath.   aspirin EC 81 MG tablet Take 81 mg by mouth daily.   Cholecalciferol (VITAMIN D3) 50 MCG (2000 UT) capsule Take 2,000 Units by mouth daily.   diazepam (VALIUM) 5 MG tablet Take 1 tablet 30 minutes prior to leaving house on day of office surgery.   fluticasone (FLONASE) 50 MCG/ACT nasal spray Place 2 sprays into both nostrils daily.   loratadine (CLARITIN) 10 MG tablet Take 1 tablet by mouth daily.   meclizine (ANTIVERT) 12.5 MG tablet Take 12.5 mg by mouth 3 (three) times daily as needed for dizziness.   montelukast (SINGULAIR) 10 MG tablet Take 10 mg by mouth at bedtime.   olmesartan (BENICAR) 20 MG tablet Take 20 mg by mouth daily.   omeprazole (PRILOSEC) 20 MG capsule Take 20 mg by mouth daily.   oxybutynin (DITROPAN-XL) 10 MG 24 hr tablet Take 10 mg by mouth daily.   simvastatin (ZOCOR) 10 MG tablet Take 10 mg by mouth daily.   spironolactone (ALDACTONE) 50 MG tablet Take 25 mg by mouth at bedtime.     Allergies:   Meperidine, Morphine, Yellow dyes (non-tartrazine), Clindamycin, Thyroid, Furosemide, Nitrofurantoin, Nitroglycerin, Penicillin g, and Sulfamethoxazole   Social History   Socioeconomic History   Marital status: Married    Spouse name: Not on file   Number of children: Not on file    Years of education: Not on file   Highest education level: Not on file  Occupational History   Not on file  Tobacco Use   Smoking status: Never   Smokeless tobacco: Never  Vaping Use   Vaping status: Never Used  Substance and Sexual Activity   Alcohol use: Never   Drug use: Never   Sexual activity: Not on file  Other Topics Concern   Not on file  Social History Narrative   Not on file   Social Determinants of Health   Financial Resource Strain: Not on file  Food Insecurity: Not on file  Transportation Needs: Not on file  Physical Activity: Not on file  Stress: Not on file  Social Connections: Not on file     Family History: The patient's family history includes Heart attack in her father and mother; Heart disease in her father and  mother; Hyperlipidemia in her brother, father, mother, and sister; Hypertension in her father, mother, and sister; Skin cancer in her father.  ROS:   Please see the history of present illness.    All other systems reviewed and are negative.  EKGs/Labs/Other Studies Reviewed:    The following studies were reviewed today: I discussed my findings with the patient at length   Recent Labs: No results found for requested labs within last 365 days.  Recent Lipid Panel No results found for: "CHOL", "TRIG", "HDL", "CHOLHDL", "VLDL", "LDLCALC", "LDLDIRECT"  Physical Exam:    VS:  BP 130/66   Pulse (!) 56   Ht 5\' 3"  (1.6 m)   Wt 241 lb 12.8 oz (109.7 kg)   SpO2 94%   BMI 42.83 kg/m     Wt Readings from Last 3 Encounters:  04/18/23 241 lb 12.8 oz (109.7 kg)  10/05/22 240 lb (108.9 kg)  09/22/22 240 lb 6.4 oz (109 kg)     GEN: Patient is in no acute distress HEENT: Normal NECK: No JVD; No carotid bruits LYMPHATICS: No lymphadenopathy CARDIAC: Hear sounds regular, 2/6 systolic murmur at the apex. RESPIRATORY:  Clear to auscultation without rales, wheezing or rhonchi  ABDOMEN: Soft, non-tender, non-distended MUSCULOSKELETAL:  No  edema; No deformity  SKIN: Warm and dry NEUROLOGIC:  Alert and oriented x 3 PSYCHIATRIC:  Normal affect   Signed, Garwin Brothers, MD  04/18/2023 3:09 PM     [facility]

## 2023-04-18 NOTE — Patient Instructions (Signed)

## 2023-05-02 DIAGNOSIS — M19071 Primary osteoarthritis, right ankle and foot: Secondary | ICD-10-CM | POA: Diagnosis not present

## 2023-05-02 DIAGNOSIS — M19072 Primary osteoarthritis, left ankle and foot: Secondary | ICD-10-CM | POA: Diagnosis not present

## 2023-05-03 DIAGNOSIS — Z7689 Persons encountering health services in other specified circumstances: Secondary | ICD-10-CM | POA: Diagnosis not present

## 2023-05-03 DIAGNOSIS — I10 Essential (primary) hypertension: Secondary | ICD-10-CM | POA: Diagnosis not present

## 2023-05-03 DIAGNOSIS — E785 Hyperlipidemia, unspecified: Secondary | ICD-10-CM | POA: Diagnosis not present

## 2023-05-03 DIAGNOSIS — E669 Obesity, unspecified: Secondary | ICD-10-CM | POA: Diagnosis not present

## 2023-05-03 DIAGNOSIS — Z8679 Personal history of other diseases of the circulatory system: Secondary | ICD-10-CM | POA: Diagnosis not present

## 2023-05-03 DIAGNOSIS — M1990 Unspecified osteoarthritis, unspecified site: Secondary | ICD-10-CM | POA: Diagnosis not present

## 2023-06-16 IMAGING — CT CT CHEST HIGH RESOLUTION
2 of 8 series · 14 of 36 positions shown, 17 images · non-contrast
Comparison: 07/16/2020, 07/19/2019, 04/25/2019, 06/18/2015

CLINICAL DATA: Pulmonary nodule, possible pulmonary fibrosis,
evaluate for progression

EXAM:
CT CHEST WITHOUT CONTRAST
TECHNIQUE: Multidetector CT imaging of the chest was performed following the
standard protocol without intravenous contrast. High resolution
imaging of the lungs, as well as inspiratory and expiratory imaging,
was performed.

[Series 5: high resolution · axial · 0.65mm/px · z∈[-135,+93]mm · 11 of 274 slices shown, 14 images]
[im 23/274  mediastinal]
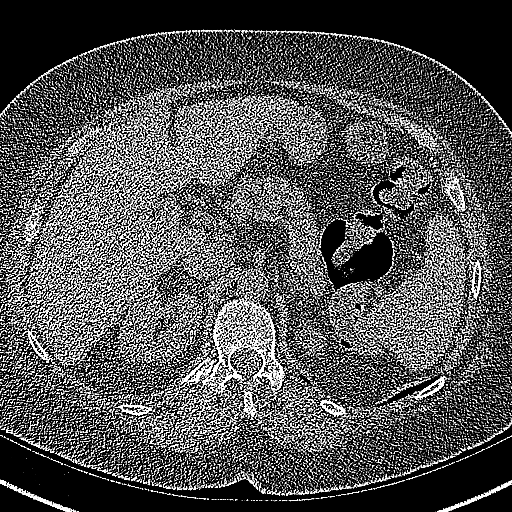
[im 23/274  lung]
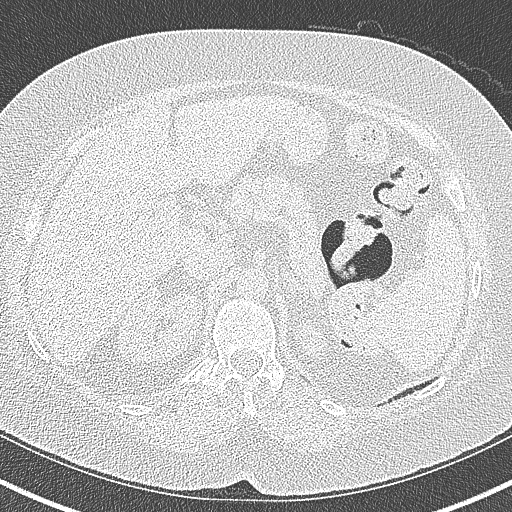
[im 46/274  lung]
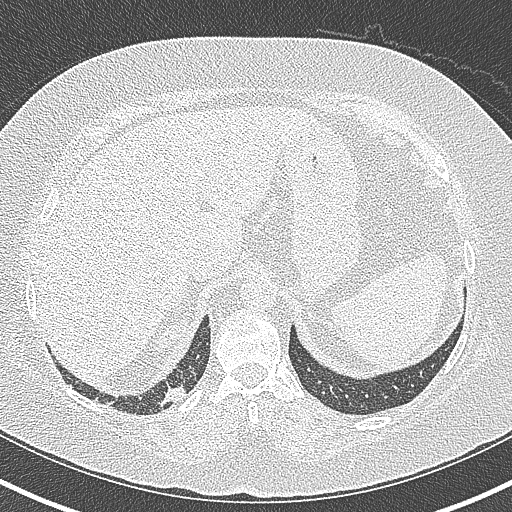
[im 69/274  lung]
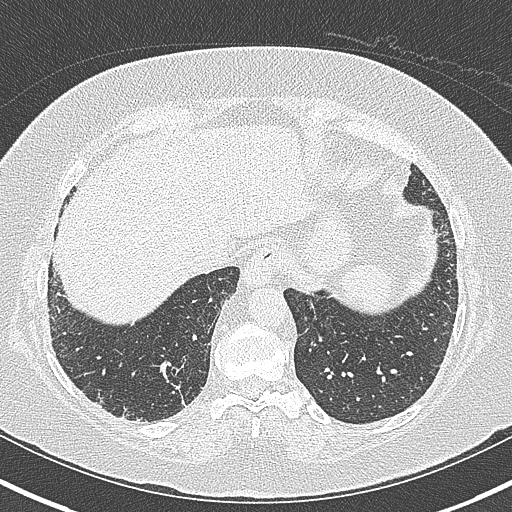
[im 92/274  lung]
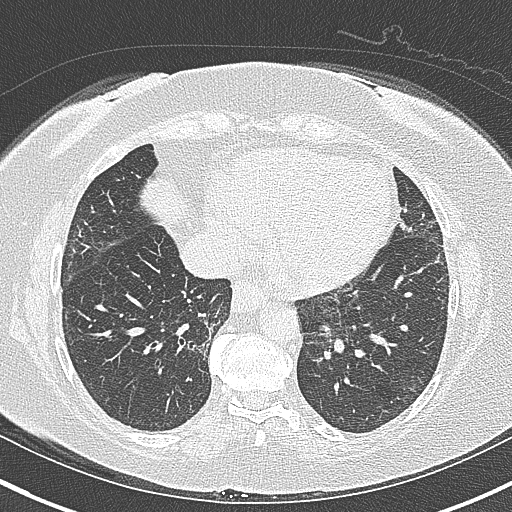
[im 114/274  mediastinal]
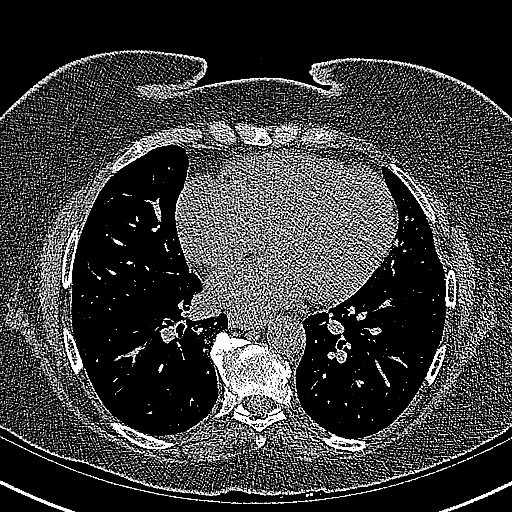
[im 114/274  lung]
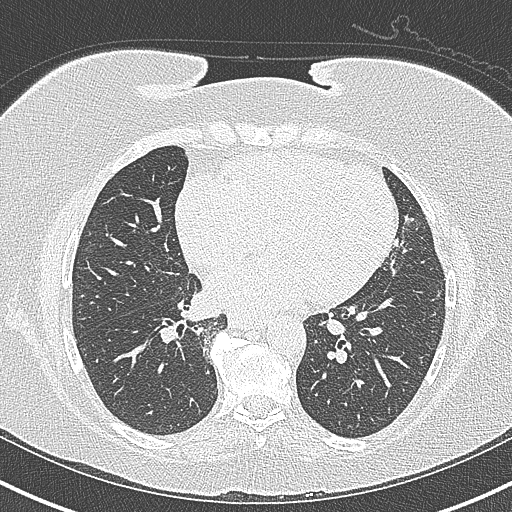
[im 137/274  lung]
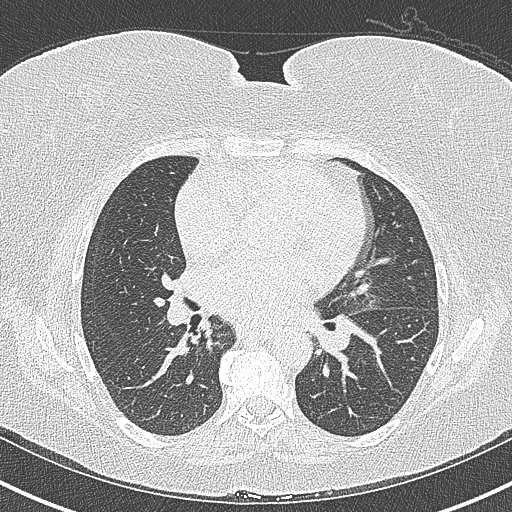
[im 160/274  lung]
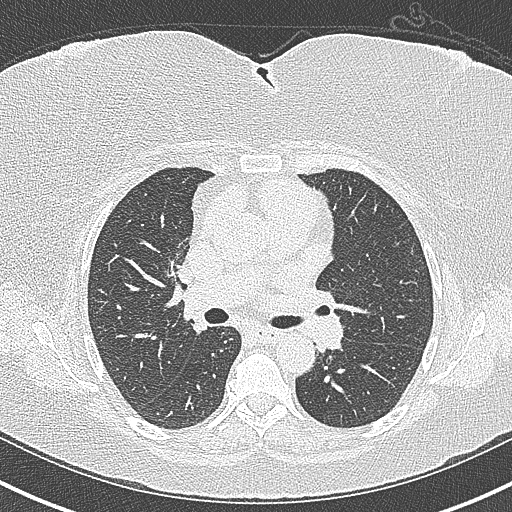
[im 183/274  lung]
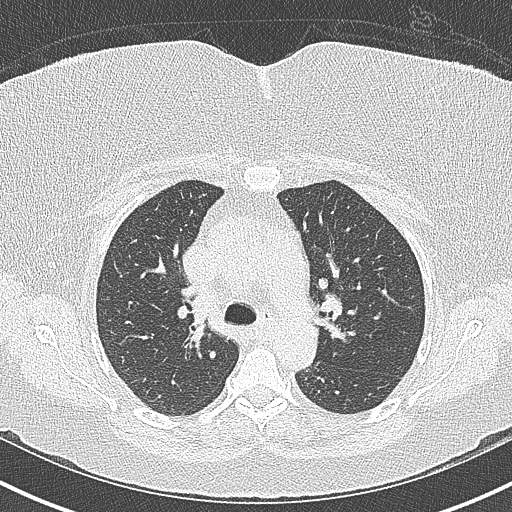
[im 205/274  mediastinal]
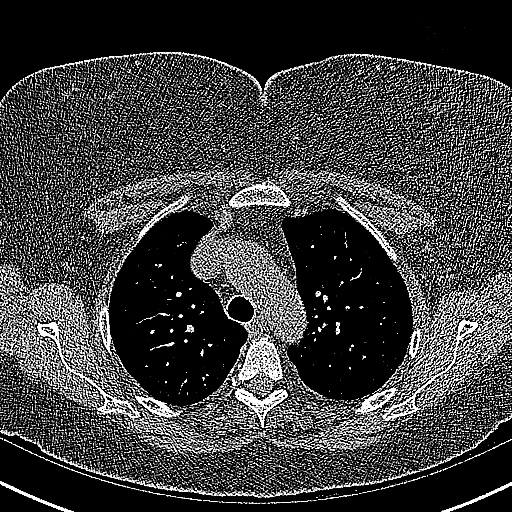
[im 205/274  lung]
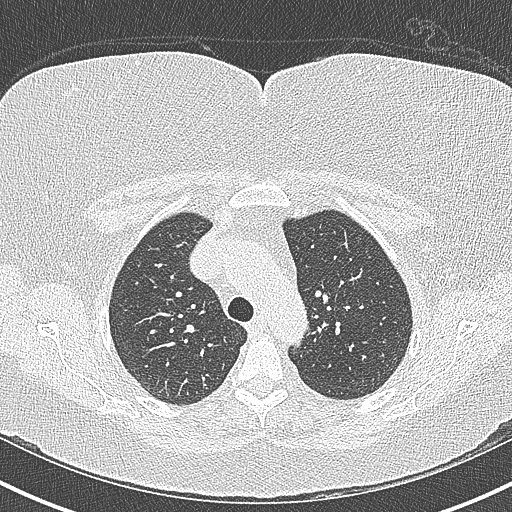
[im 228/274  lung]
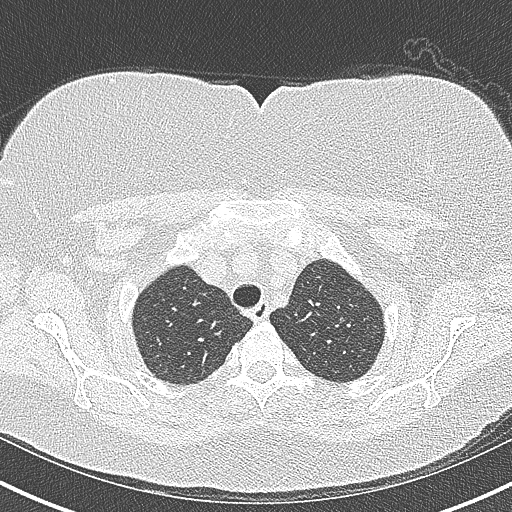
[im 251/274  lung]
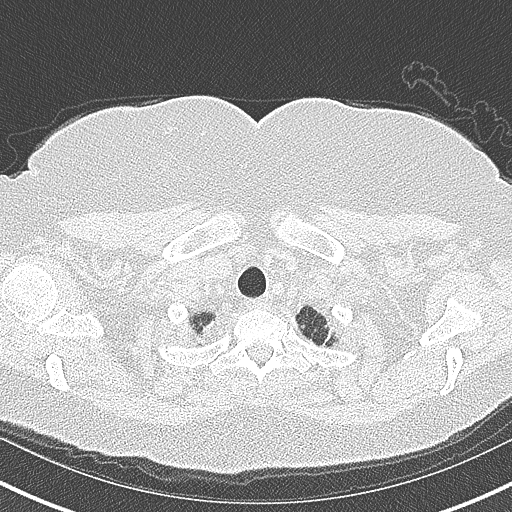

[Series 7: coronal · coronal · 0.56mm/px · 3 of 125 slices shown]
[im 25/125  lung]
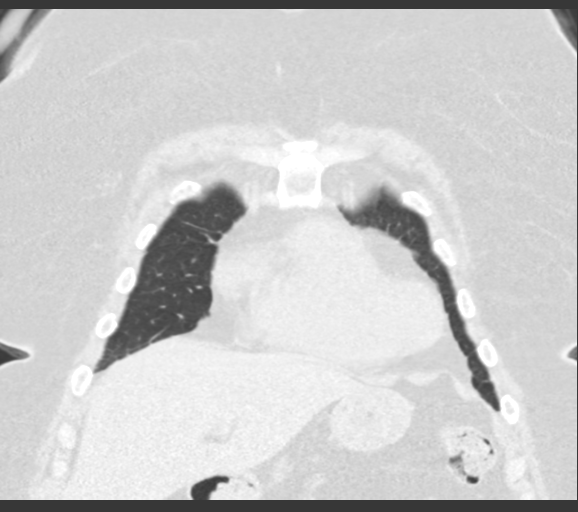
[im 50/125  lung]
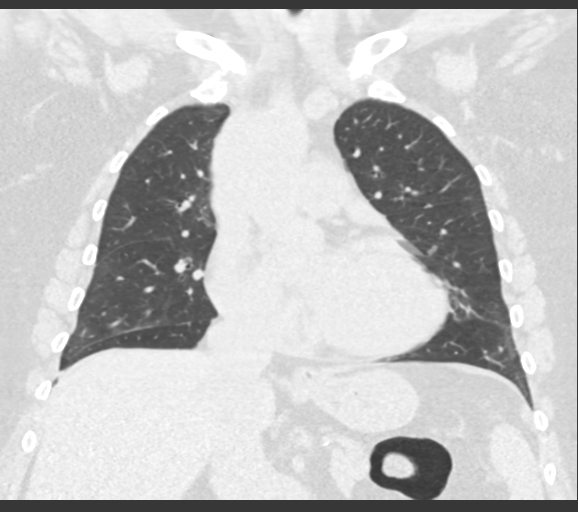
[im 75/125  lung]
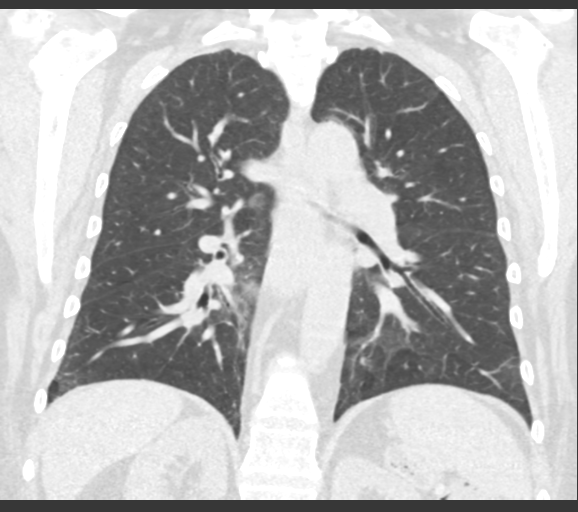

[14 of 36 positions shown; findings below may reference images not displayed]

FINDINGS: Cardiovascular: Scattered aortic atherosclerosis. Cardiomegaly. No
pericardial effusion.

Mediastinum/Nodes: No enlarged mediastinal, hilar, or axillary lymph
nodes. Thyroid gland, trachea, and esophagus demonstrate no
significant findings.

Lungs/Pleura: Mild pulmonary fibrosis in a pattern with apical to
basal gradient, featuring irregular peripheral interstitial opacity
and septal thickening without evidence of bronchiolectasis or
honeycombing. Fibrosis is not significantly changed compared to
immediate prior examination. Mild lobular air trapping on expiratory
phase imaging. Stable, definitively benign nodule of the right lung
base measuring 1.4 x 0.9 cm (series 5, image 230). No pleural
effusion or pneumothorax.

Upper Abdomen: No acute abnormality.  Status post cholecystectomy.

Musculoskeletal: No chest wall mass or suspicious bone lesions
identified.
IMPRESSION: 1. Mild pulmonary fibrosis in a pattern with apical to basal
gradient, featuring irregular peripheral interstitial opacity and
septal thickening without evidence of bronchiolectasis or
honeycombing. Fibrotic findings are not significantly changed
compared to immediate prior examination although clearly worsened
over time dating back to 06/18/2015. Strictly characterized,
findings are (early) indeterminate for UIP per consensus guidelines,
although progression over time is concerning for UIP: Diagnosis of
Idiopathic Pulmonary Fibrosis: An Official ATS/ERS/JRS/ALAT Clinical
Practice Guideline. Am J Respir Crit Care Med Vol 198, Hugo Arturo 5,
ppe22-e[DATE].
2. Stable, definitively benign nodule of the right lung base. No
further follow-up is specifically required for this benign nodule.
3. Cardiomegaly.

Aortic Atherosclerosis ([id number]).

## 2023-07-19 DIAGNOSIS — H903 Sensorineural hearing loss, bilateral: Secondary | ICD-10-CM | POA: Diagnosis not present

## 2023-07-26 DIAGNOSIS — H903 Sensorineural hearing loss, bilateral: Secondary | ICD-10-CM | POA: Diagnosis not present

## 2023-08-01 DIAGNOSIS — M19072 Primary osteoarthritis, left ankle and foot: Secondary | ICD-10-CM | POA: Diagnosis not present

## 2023-08-01 DIAGNOSIS — M19071 Primary osteoarthritis, right ankle and foot: Secondary | ICD-10-CM | POA: Diagnosis not present

## 2023-08-04 ENCOUNTER — Telehealth: Payer: Self-pay | Admitting: Internal Medicine

## 2023-08-04 NOTE — Telephone Encounter (Signed)
I did not need this encounter. °

## 2023-08-24 NOTE — Patient Instructions (Addendum)
 Solitary pulmonary nodule  -15 mm nodule right costophrenic angle stable between December 2020 in December 2021. NO change in Dec 2022 CT. Not easily visible Feb 2024 CT  Plan - expectant followup  Abnormal CT of the chest -early interstitial lung abnormalities December 2021 and dec 2022 and feb 2024 Personal history of COVID-19 History of community acquired pneumonia remote   -There is  ver very mild scar tissue in the bottom of the lung.  This probably reflects admission for pneumonia 15 years ago and COVID-19 in February 2020 and also repeat COVID-19 in August/September 2021. We call this interstitial lung abnormality  - PFT improved fev 2022 -> feb 2023 -> stable Feb 2024-. Stabl Jan 2025 - CT scan stable feb 2024 - Curently asymptomatic   Plan - surveillance approach  - avoiding biopsy or empiric treatment approach -spiro/dlco - Jan 2026 (1 year) - HRCT - feb 2026 (1 year)    RNP antibiody positive P-ANCA positive  Plan   - expectant approach per shared decision making  Follow-up - return in 1 year  for  15 min visit but after PFT  and CT testing  - simple walk test at followup

## 2023-08-24 NOTE — Progress Notes (Signed)
 OV 09/29/2020  Subjective:  Patient ID: Judy Lucas, female , DOB: Feb 12, 1956 , age 68 y.o. , MRN: [id number] , ADDRESS: [phone] [place] [address] PCP Gable Cambric, MD Patient Care Team: Gable Cambric, MD as PCP - General (Internal Medicine)  This Provider for this visit: Treatment Team:  Attending Provider: Geronimo Amel, MD    09/29/2020 -   Chief Complaint  Patient presents with   Follow-up    Doing well  .   ICD-10-CM   1. Solitary pulmonary nodule  R91.1   2. Abnormal CT of the chest  R93.89   3. Personal history of COVID-19  Z86.16   4. History of community acquired pneumonia  Z87.01      HPI Judy Lucas 68 y.o. -follow-up for the above issues.  She was a patient of Dr. Gretta.  Dr. Gretta is now in the ICU rotation service.  Patient is being transferred to Dr. Geronimo.  She is solitary pulmonary nodule 15 mm in the right costophrenic angle.  She had follow-up 1 year scan in December 2021 and it is stable.  She has early interstitial lung abnormalities in the lung bases.  According to the radiologist it is worse.  According to the patient she is asymptomatic without any shortness of breath or cough wheezing orthopnea proximal nocturnal dyspnea.  She denies any acid reflux.  She denies any tobacco smoking.  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  There is no mold or mildew in the house.  No feather pillow.  She has a maple tree allergy that seasonal but otherwise she is fine.  She gives a history of community-acquired pneumonia and hospitalization 15 years ago.  Also COVID-19 in February 2020 and repeat in August/September 2021 for which she was not hospitalized.  She believes the scar tissue might be related to that.    CT Chest data  Narrative & Impression  CLINICAL DATA:  Pulmonary nodule, follow-up examination   EXAM: CT CHEST WITHOUT CONTRAST   TECHNIQUE: Multidetector CT imaging of the chest was performed following  the standard protocol without IV contrast.   COMPARISON:  07/19/2019, 06/18/2015   FINDINGS: Cardiovascular: No significant coronary artery calcification. Global cardiac size within normal limits. No pericardial effusion. Central pulmonary arteries are of normal caliber. Mild atherosclerotic calcifications seen within the thoracic aorta. No aortic aneurysm.   Mediastinum/Nodes: Visualized thyroid  unremarkable. No pathologic thoracic adenopathy. Esophagus unremarkable.   Lungs/Pleura: Ovoid nodule seen within the right costophrenic angle at axial image # 113 is stable measuring 7 mm x 15 mm (average 11 mm). Focal nodule within the focus of parenchymal scarring within the right lower lobe, previously seen on axial image # 78, has resolved. 3 mm nodule within the left upper lobe, axial image # 27, is stable. Subpleural ground-glass pulmonary infiltrate with associated interlobular septal thickening at the lung bases bilaterally has progressed slightly since prior examination, likely reflecting progressive fibrotic change. No pneumothorax or pleural effusion. Central airways are widely patent.   Upper Abdomen: Cholecystectomy has been performed. No acute abnormality.   Musculoskeletal: No acute bone abnormality.   IMPRESSION: Stable dominant pulmonary nodule within the right lower lobe. Continued imaging follow-up is recommended in 1 year to document continued stability. This recommendation follows the consensus statement: Guidelines for Management of Incidental Pulmonary Nodules Detected on CT Images: From the Fleischner Society 2017; Radiology 2017; 284:228-243.   Mild interval progression of probable bibasilar pulmonary fibrotic change.  Aortic Atherosclerosis (ICD10-I70.0).     Electronically Signed   By: Dorethia Molt MD   On: 07/17/2020 04:26          OV 07/30/2021  Subjective:  Patient ID: Judy Lucas, female , DOB: [birth year] , age 55 y.o. , MRN:  [id number] , ADDRESS: [phone] [place] [address] PCP Gable Cambric, MD Patient Care Team: Gable Cambric, MD as PCP - General (Internal Medicine)  This Provider for this visit: Treatment Team:  Attending Provider: Geronimo Amel, MD    07/30/2021 -   Chief Complaint  Patient presents with   Follow-up    Pt is here to discuss results of recent CT.  Pt states she has been doing okay since last visit and denies any complaints.   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  HPI Judy Lucas 68 y.o. -last seen earlier this year.  Since then stable no interim COVID.  No interim issues.  Early morning she has occasional dry cough from what she thinks is allergies.  No shortness of breath with exertion.  Sometimes she feels muscle pull on the back of her chest but otherwise no issues.  This is baseline and chronic.  She had high-resolution CT scan of the chest that shows right lower lobe nodule is stable and radiologist does deem this is benign.  However they are concerned about ILD changes.  I did visualize this and agree with them.  They are saying it is worst in 2016 but stable in the last 1 year.  I did share this with the patient.  Did explain to her that some of the ILD's could get worse.  She is interested in getting a work-up done and following physician recommendation on this.  She is somewhat reassured that the ILD is deemed stable in the last 1 year on CT scan.  Of note her husband is also here with her today.    CT Chest data  CT Chest High Resolution  Result Date: 07/28/2021 CLINICAL DATA:  Pulmonary nodule, possible pulmonary fibrosis, evaluate for progression EXAM: CT CHEST WITHOUT CONTRAST TECHNIQUE: Multidetector CT imaging of the chest was performed  following the standard protocol without intravenous contrast. High resolution imaging of the lungs, as well as inspiratory and expiratory imaging, was performed. COMPARISON:  07/16/2020, 07/19/2019, 04/25/2019, 06/18/2015 FINDINGS: Cardiovascular: Scattered aortic atherosclerosis. Cardiomegaly. No pericardial effusion. Mediastinum/Nodes: No enlarged mediastinal, hilar, or axillary lymph nodes. Thyroid  gland, trachea, and esophagus demonstrate no significant findings. Lungs/Pleura: Mild pulmonary fibrosis in a pattern with apical to basal gradient, featuring irregular peripheral interstitial opacity and septal thickening without evidence of bronchiolectasis or honeycombing. Fibrosis is not significantly changed compared to immediate prior examination. Mild lobular air trapping on expiratory phase imaging. Stable, definitively benign nodule of the right lung base measuring 1.4 x 0.9 cm (series 5, image 230). No pleural effusion or pneumothorax. Upper Abdomen: No acute abnormality.  Status post cholecystectomy. Musculoskeletal: No chest wall mass or suspicious bone lesions identified. IMPRESSION: 1. Mild pulmonary fibrosis in a pattern with apical to basal gradient, featuring irregular peripheral interstitial opacity and septal thickening without evidence of bronchiolectasis or honeycombing. Fibrotic findings are not significantly changed compared to immediate prior examination although clearly worsened over time dating back to 06/18/2015. Strictly characterized, findings are (early) indeterminate for UIP per consensus guidelines, although  progression over time is concerning for UIP: Diagnosis of Idiopathic Pulmonary Fibrosis: An Official ATS/ERS/JRS/ALAT Clinical Practice Guideline. Am JINNY [employer] Crit Care Med Vol 198, Iss 5, [phone], Apr 15 2017. 2. Stable, definitively benign nodule of the right lung base. No further follow-up is specifically required for this benign nodule. 3. Cardiomegaly. Aortic Atherosclerosis  (ICD10-I70.0). Electronically Signed   By: Marolyn JONETTA Jaksch M.D.   On: 07/28/2021 14:49      OV 09/30/2021  Subjective:  Patient ID: Judy Lucas, female , DOB: 1956-07-24 , age 51 y.o. , MRN: [id number] , ADDRESS: [phone] [place] [address] PCP Gable Cambric, MD Patient Care Team: Gable Cambric, MD as PCP - General (Internal Medicine)  This Provider for this visit: Treatment Team:  Attending Provider: Geronimo Amel, MD    09/30/2021 -   Chief Complaint  Patient presents with   Follow-up    PFT performed today.  Pt states she has been doing okay since last visit and denies any complaints.     HPI Chandel Zaun 68 y.o. -returns for follow-up.  She is doing well.  According to her husband she is wheezing less and is less short of breath now.  She had pulmonary function testing that shows an improvement compared to 1 year ago and it is actually normal now.  In addition a walking desaturation test is normal.  She had autoimmune profile and this shows RNP antibody positive and p-ANCA positive.  She tells me that she has a chronic history of allergies and also some kind of enzyme deficiency and so she think she has autoimmune issues but it sounds like it is more like maple tree allergies she takes Kenalog  shots earlier in the year.  She is also allergic to various food items.  I did share with her the autoimmune antibody results.  She prefers a more expectant approach and seeing a rheumatologist.  Also given her improvement in pulmonary function test and normal walking desaturation test and absence of crackles on lung exam today we took a shared decision making approach to continue surveillance as opposed to going getting a lung biopsy or starting empiric treatment.  Specifically her ILD question at was done in detail and the details are below here  [facility] Comprehensive ILD Questionnaire  Symptoms:  Past Medical History :  GERD + Allergies Hx of ppna Hx of  cpovid   ROS:  Arthralgia Occasionally has sore throat and takes omeprazole Has dry eyes Has allergies and rash  FAMILY HISTORY of LUNG DISEASE:  Negative*  PERSONAL EXPOSURE HISTORY:  No smoking no vaping.  No marijuana use no cocaine use  HOME  EXPOSURE and HOBBY DETAILS :  [place]  Age of the home is 47-1/2 years.  She has lived there since then.  Detail organic antigen exposure history in the house is negative  OCCUPATIONAL HISTORY (122 questions) : -Did some tobacco growing as a young person.  Has done some laboratory work.  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  But overall worked as a engineer, civil (consulting)  PULMONARY TOXICITY HISTORY (27 items):  Took Macrodantin several years ago and is highly allergic to it Is taking prednisone intermittently for allergies Takes Kenalog  shot once a year  INVESTIGATIONS: x       Latest Reference Range & Units 07/27/20 10:19 07/30/21 11:06  Anti Nuclear Antibody (ANA) NEGATIVE   NEGATIVE  Angiotensin-Converting Enzyme 9 - 67 U/L  23  Cyclic Citrullin Peptide Ab UNITS <16 <16  ds DNA Ab IU/mL  <1  ENA RNP Ab 0.0 - 0.9 AI  3.4 (H)  Myeloperoxidase Abs AI  <1.0  Serine Protease 3 AI  <1.0  RA Latex Turbid. <14 IU/mL <14 <14  Atypical P-ANCA titer <1:20 titer  1:160 (H)  SSA (Ro) (ENA) Antibody, IgG <1.0 NEG AI <1.0 NEG <1.0 NEG  SSB (La) (ENA) Antibody, IgG <1.0 NEG AI <1.0 NEG <1.0 NEG  Scleroderma (Scl-70) (ENA) Antibody, IgG <1.0 NEG AI  <1.0 NEG  (H): Data is abnormally high   Latest Reference Range & Units 07/30/21 11:06  A.Fumigatus #1 Abs Negative  Negative  Micropolyspora faeni, IgG Negative  Negative  Thermoactinomyces vulgaris, IgG Negative  Negative  A. Pullulans Abs Negative  Negative  Thermoact. Saccharii Negative  Negative  Pigeon Serum Abs Negative  Negative    OV 09/22/2022  Subjective:  Patient ID: Judy Lucas, female , DOB: [birth year] , age 50 y.o. , MRN:  [id number] , ADDRESS: [address] [place] [address] PCP Gable Cambric, MD Patient Care Team: Gable Cambric, MD as PCP - General (Internal Medicine)  This Provider for this visit: Treatment Team:  Attending Provider: Geronimo Amel, MD    09/22/2022 -   Chief Complaint  Patient presents with   Follow-up    Pft today.  Doing well.  No sx noted.     HPI Charnel Giles 68 y.o. -returns for follow-up of her interstitial lung abnormalities [ILA].  She continues to be asymptomatic.  Her husband is with her here today.  He is not an independent historian.  She says in the interim she is got some nonhealing left lower extremity ulcers.  She is going to see Dr. Melvenia and vascular vein specialist.  In addition October 2023 she had clindamycin for dental infection and had reaction that put her in the ER.  It is now an allergy but respiratory wise she is stable no other emergency room visits no hospitalizations.  No other medication changes.  She had a high-resolution CT chest there are some motion artifact but her interstitial lung abnormalities according to me is very minimal and stable.  Her pulmonary function test also is stable and is range bound.  The previous nodule is not well visualized.  She is up-to-date with her vaccines.  She did ask me what she could do to prevent her from developing ILD.  We talked about acid reflux control and preventing respiratory viruses.  She has seen Dr. Charlanne for this.     CT Chest data  CT Chest High Resolution  Result Date: 09/20/2022 CLINICAL DATA:  Lung nodule. EXAM: CT CHEST WITHOUT CONTRAST TECHNIQUE: Multidetector CT imaging of the chest was performed following the standard protocol without intravenous contrast. High resolution imaging of the lungs, as well as inspiratory and expiratory imaging, was performed. RADIATION DOSE REDUCTION: This exam was performed according to the departmental dose-optimization program which includes automated  exposure control, adjustment of the mA and/or kV according to patient size and/or use of iterative reconstruction technique. COMPARISON:  10/27/2021, 07/16/2020. FINDINGS: Cardiovascular: Atherosclerotic calcification of the aorta. Heart is enlarged. No pericardial effusion. Mediastinum/Nodes: No pathologically enlarged mediastinal or axillary lymph nodes. Hilar regions are difficult to definitively evaluate without IV contrast. Distal esophageal wall thickening can be seen in the setting of gastroesophageal reflux. Lungs/Pleura: Image quality is degraded by expiratory phase imaging. Subpleural ground-glass in the posterior lower lobes largely clears on prone imaging. Mild subpleural  scarring in the right lower lobe. Any further assessment for subtle interstitial lung disease is hindered by expiratory phase imaging. There is mild air trapping. No pleural fluid. Airway is unremarkable. Upper Abdomen: Visualized portions of the liver, adrenal glands, right kidney, spleen, pancreas, stomach and bowel are grossly unremarkable. No upper abdominal adenopathy. Musculoskeletal: Degenerative changes in the spine. No worrisome lytic or sclerotic lesions. IMPRESSION: 1. Image quality is markedly degraded by expiratory phase imaging, limiting the evaluation for subtle subpleural fibrosis, as suggested on comparison exams. 2. Mild air trapping is indicative of small airways disease. 3.  Aortic atherosclerosis (ICD10-I70.0). Electronically Signed   By: Newell Eke M.D.   On: 09/20/2022 15:08       OV 08/25/2023  Subjective:  Patient ID: Judy Lucas, female , DOB: 05/15/1956 , age 80 y.o. , MRN: [id number] , ADDRESS: [address] [place] [address] PCP Gable Cambric, MD Patient Care Team: Gable Cambric, MD as PCP - General (Internal Medicine)  This Provider for this visit: Treatment Team:  Attending Provider: Geronimo Amel, MD  Follow-up lung nodule right lower lobe  Follow-up possible ILD  Interstitial lung abnormality  -  pneumonia 15 years ago and COVID-19 in February 2020 and also repeat COVID-19 in August/September 2021  - radiologist feels worse since 2016 but stable 20210> 2022.   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  08/25/2023 -   Chief Complaint  Patient presents with   Follow-up    PFT f/u, pt is complaining of lymph node (left side of chest) x3 yrs     HPI Carnelia Oscar 68 y.o. -1 year follow-up for lady with interstitial lung abnormalities.  She is opted for expectant follow-up.  For the calendar year 2024 Interim Health status: No new complaints No new medical problems. No new surgeries. No ER visits. No Urgent care visits. No changes to medications.  She is mostly sedentary although she does work around the house.  Because of her ankle issues she does not do much exercise.  But for the what work she does she reports no dyspnea cough or wheezing or chest pain orthopnea paroxysmal nocturnal dyspnea.  She had pulmonary function test today and it shows continued 1 year stability.  She again prefers to have an expectant surveillance approach with supportive care.       Shortness of Breath 0 -> 5 scale with 5 being worst (score 6 If unable to do) 09/22/2022  08/25/2023   At rest 0 0   Simple tasks - showers, clothes change, eating, shaving 0 0   Household (dishes, doing bed, laundry) 0 0   Shopping 0 0   Walking level at own pace 0 0   Walking up Stairs 0 0   Total (30-36) Dyspnea Score 0 0       Non-dyspnea symptoms (0-> 5 scale) 09/30/2021 09/22/2022  08/25/2023   How bad is your cough? Mild due to allergies    How bad is your fatigue 0 0   How bad is nausea 0 0   How bad is vomiting?  00 0   How bad is diarrhea? 0 0   How bad is anxiety? 0 0   How bad is depression 0 0   Any chronic pain - if so where and how bad 0 0       Simple office walk 185 feet x  3  laps goal with forehead  probe 09/30/2021    O2 used ra   Number laps completed 3   Comments about pace avg   Resting Pulse Ox/HR 100% and 51/min   Final Pulse Ox/HR 99% and 86/min   Desaturated </= 88% no   Desaturated <= 3% points no   Got Tachycardic >/= 90/min no   Symptoms at end of test none   Miscellaneous comments Nomal tste     Modified Six Minute Walk - 09/22/22 1300     Type of O2 used  Room Air    Number of laps completed  3    Lap Pace Brisk    Resting Heartrate 55 bpm    Final Heartrate 78 bpm    Resting Pulse Ox 97 %    Desaturated to <= 3 points No    Desaturated to < 88% No    Became tachycardic No    Symptoms  No sx noted    comments Patient walked 3 laps with no difficulty          PFT    Latest Ref Rng & Units 08/25/2023    9:43 AM 09/22/2022   12:44 PM 09/30/2021    9:52 AM 09/29/2020    8:55 AM  PFT Results  FVC-Pre L 2.29  P 2.17  2.43  2.30   FVC-Predicted Pre % 77  P 73  81  76   FVC-Post L    2.45   FVC-Predicted Post %    81   Pre FEV1/FVC % % 83  P 87  87  86   Post FEV1/FCV % %    87   FEV1-Pre L 1.91  P 1.90  2.12  1.97   FEV1-Predicted Pre % 85  P 84  93  85   FEV1-Post L    2.15   DLCO uncorrected ml/min/mmHg 17.90  P 17.30  21.06  18.86   DLCO UNC% % 94  P 91  110  98   DLCO corrected ml/min/mmHg 17.90  P 17.30  21.06  18.86   DLCO COR %Predicted % 94  P 91  110  98   DLVA Predicted % 116  P 111  127  122   TLC L    3.79   TLC % Predicted %    77   RV % Predicted %    71     P Preliminary result       LAB RESULTS last 96 hours No results found.  LAB RESULTS last 90 days Recent Results (from the past 2160 hours)  Pulmonary function test     Status: None (Preliminary result)   Collection Time: 08/25/23  9:43 AM  Result Value Ref Range   FVC-Pre 2.29 L   FVC-%Pred-Pre 77 %   FEV1-Pre 1.91 L   FEV1-%Pred-Pre 85 %   FEV6-Pre 2.29 L   FEV6-%Pred-Pre 81 %   Pre FEV1/FVC ratio 83 %   FEV1FVC-%Pred-Pre 109 %   Pre  FEV6/FVC Ratio 100 %   FEV6FVC-%Pred-Pre 104 %   FEF 25-75 Pre 2.05 L/sec   FEF2575-%Pred-Pre 105 %   DLCO unc 17.90 ml/min/mmHg   DLCO unc % pred 94 %   DLCO cor 17.90 ml/min/mmHg   DLCO cor % pred 94 %   DL/VA 5.14 ml/min/mmHg/L   DL/VA % pred 883 %         has a past medical history of Abnormal CT of the chest (08/27/2019), Achilles tendinitis of left lower extremity (05/02/2022),  Allergic rhinitis, Allergy, Anxiety, Chest discomfort (02/20/2019), Chest pain (01/22/2019), Choline deficiency, Diverticulosis, Dysphagia (01/22/2019), Heel spur, left (05/02/2022), Hyperlipidemia, Hypertension, Morbid obesity (HCC) (01/22/2019), Osteoarthritis (01/22/2019), Pharyngoesophageal dysphagia (01/22/2019), Pneumonia (05/2015), Renal insufficiency (01/22/2019), Sensorineural hearing loss (SNHL) of both ears (02/24/2021), Tenosynovitis of left ankle (06/07/2022), Tenosynovitis of right ankle (06/07/2022), and Tightness of heel cord, left (05/02/2022).   reports that she has never smoked. She has never used smokeless tobacco.  Past Surgical History:  Procedure Laterality Date   ABDOMINAL HYSTERECTOMY     APPENDECTOMY     CHOLECYSTECTOMY     COLONOSCOPY  10/14/2015   Colonic polyp status post polypectomy. Predominantly sigmoid diverticulosis   COLONOSCOPY  05/31/2019   Dr Towana  Benign neoplasm of cecum. Repeat in 5 years   ENDOVENOUS ABLATION SAPHENOUS VEIN W/ LASER Left 10/17/2019   endovenous laser ablation anterior accessory branch of left greater saphenous vein by Medford Blade MD    ENDOVENOUS ABLATION SAPHENOUS VEIN W/ LASER Left 10/05/2022   endovenous laser ablation left small saphenous vein by Medford Blade MD   ESOPHAGOGASTRODUODENOSCOPY  05/31/2019   Dr Towana. There was no esophagitis. Multiple biopsies obtained from the esophagus to evaluate for eosinophilic esophagitis. Normal Z-Line. The EG junction did not distend well and had increased spasm. A wide bore stricture or ring  could have been missed due to lack of distension but there was no obvious stricture.   feet surgery     WRIST SURGERY      Allergies  Allergen Reactions   Meperidine Shortness Of Breath   Morphine Shortness Of Breath   Yellow Dyes (Non-Tartrazine) Anaphylaxis   Clindamycin Rash    Significant erythroderma with the itching associated with clindamycin.  No swelling, wheezing, dyspnea, or airway compromise.   Thyroid  Swelling   Furosemide Rash    SWELLING ALLERGY    Nitrofurantoin Rash   Nitroglycerin  Palpitations   Penicillin G Rash   Sulfamethoxazole Rash    Immunization History  Administered Date(s) Administered   Fluad Quad(high Dose 65+) 05/27/2020   Influenza, High Dose Seasonal PF 05/27/2021   Influenza,inj,Quad PF,6-35 Mos 05/16/2019   Influenza-Unspecified 05/30/2022   PFIZER(Purple Top)SARS-COV-2 Vaccination 09/09/2019, 09/23/2019, 07/22/2020   Zoster Recombinant(Shingrix) 05/14/2018    Family History  Problem Relation Age of Onset   Hyperlipidemia Mother    Hypertension Mother    Heart disease Mother    Heart attack Mother    Skin cancer Father    Hyperlipidemia Father    Hypertension Father    Heart disease Father    Heart attack Father    Hypertension Sister    Hyperlipidemia Sister    Hyperlipidemia Brother      Current Outpatient Medications:    albuterol (VENTOLIN HFA) 108 (90 Base) MCG/ACT inhaler, Inhale 2 puffs into the lungs every 4 (four) hours as needed for wheezing or shortness of breath., Disp: , Rfl:    aspirin EC 81 MG tablet, Take 81 mg by mouth daily., Disp: , Rfl:    Cholecalciferol (VITAMIN D3) 50 MCG (2000 UT) capsule, Take 2,000 Units by mouth daily., Disp: , Rfl:    diazepam  (VALIUM ) 5 MG tablet, Take 1 tablet 30 minutes prior to leaving house on day of office surgery., Disp: 1 tablet, Rfl: 0   fluticasone (FLONASE) 50 MCG/ACT nasal spray, Place 2 sprays into both nostrils daily., Disp: , Rfl:    loratadine (CLARITIN) 10 MG tablet,  Take 1 tablet by mouth daily., Disp: , Rfl:    meclizine (ANTIVERT) 12.5  MG tablet, Take 12.5 mg by mouth 3 (three) times daily as needed for dizziness., Disp: , Rfl:    montelukast (SINGULAIR) 10 MG tablet, Take 10 mg by mouth at bedtime., Disp: , Rfl:    olmesartan (BENICAR) 20 MG tablet, Take 20 mg by mouth daily., Disp: , Rfl:    omeprazole (PRILOSEC) 20 MG capsule, Take 20 mg by mouth daily., Disp: , Rfl:    oxybutynin (DITROPAN-XL) 10 MG 24 hr tablet, Take 10 mg by mouth daily., Disp: , Rfl:    simvastatin (ZOCOR) 10 MG tablet, Take 10 mg by mouth daily., Disp: , Rfl:    spironolactone (ALDACTONE) 50 MG tablet, Take 25 mg by mouth at bedtime., Disp: , Rfl:       Objective:   Vitals:   08/25/23 1025  BP: 122/60  Pulse: (!) 52  SpO2: 97%  Weight: 246 lb (111.6 kg)  Height: 5' 3 (1.6 m)    Estimated body mass index is 43.58 kg/m as calculated from the following:   Height as of this encounter: 5' 3 (1.6 m).   Weight as of this encounter: 246 lb (111.6 kg).  @WEIGHTCHANGE @  Filed Weights   08/25/23 1025  Weight: 246 lb (111.6 kg)     Physical Exam   General: No distress. Looks well O2 at rest: no Cane present: no Sitting in wheel chair: no Frail: no Obese: yes Neuro: Alert and Oriented x 3. GCS 15. Speech normal Psych: Pleasant Resp:  Barrel Chest - no.  Wheeze - no, Crackles - no, No overt respiratory distress CVS: Normal heart sounds. Murmurs - no Ext: Stigmata of Connective Tissue Disease - no HEENT: Normal upper airway. PEERL +. No post nasal drip        Assessment:       ICD-10-CM   1. Abnormal CT of the chest  R93.89 CT Chest High Resolution    Pulmonary function test    2. Solitary pulmonary nodule  R91.1 CT Chest High Resolution    Pulmonary function test    3. DOE (dyspnea on exertion)  R06.09 CT Chest High Resolution    Pulmonary function test         Plan:     Patient Instructions  Solitary pulmonary nodule  -15 mm nodule right  costophrenic angle stable between December 2020 in December 2021. NO change in Dec 2022 CT. Not easily visible Feb 2024 CT  Plan - expectant followup  Abnormal CT of the chest -early interstitial lung abnormalities December 2021 and dec 2022 and feb 2024 Personal history of COVID-19 History of community acquired pneumonia remote   -There is  ver very mild scar tissue in the bottom of the lung.  This probably reflects admission for pneumonia 15 years ago and COVID-19 in February 2020 and also repeat COVID-19 in August/September 2021. We call this interstitial lung abnormality  - PFT improved fev 2022 -> feb 2023 -> stable Feb 2024-. Stabl Jan 2025 - CT scan stable feb 2024 - Curently asymptomatic   Plan - surveillance approach  - avoiding biopsy or empiric treatment approach -spiro/dlco - Jan 2026 (1 year) - HRCT - feb 2026 (1 year)    RNP antibiody positive P-ANCA positive  Plan   - expectant approach per shared decision making  Follow-up - return in 1 year  for  15 min visit but after PFT  and CT testing  - simple walk test at followup   FOLLOWUP Return in about 1 year (around 08/24/2024)  for 15 min visit, after HRCT chest, after Spiro and DLCO, with Dr Geronimo, Face to Face Visit.    SIGNATURE    Dr. Dorethia Geronimo, M.D., F.C.C.P,  Pulmonary and Critical Care Medicine Staff Physician, [facility] Director - Interstitial Lung Disease  Program  Pulmonary Fibrosis [facility] at [facility] [place], [place], [address]  Pager: [phone], If no answer or between  15:00h - 7:00h: call [phone] Telephone: [phone]  1:09 PM 08/25/2023

## 2023-08-25 ENCOUNTER — Ambulatory Visit: Payer: Medicare HMO | Admitting: Internal Medicine

## 2023-08-25 ENCOUNTER — Encounter: Payer: Self-pay | Admitting: Internal Medicine

## 2023-08-25 VITALS — BP 122/60 | HR 52 | Ht 63.0 in | Wt 246.0 lb

## 2023-08-25 DIAGNOSIS — Z8616 Personal history of COVID-19: Secondary | ICD-10-CM

## 2023-08-25 DIAGNOSIS — R911 Solitary pulmonary nodule: Secondary | ICD-10-CM

## 2023-08-25 DIAGNOSIS — R9389 Abnormal findings on diagnostic imaging of other specified body structures: Secondary | ICD-10-CM

## 2023-08-25 DIAGNOSIS — R0609 Other forms of dyspnea: Secondary | ICD-10-CM

## 2023-08-25 DIAGNOSIS — J849 Interstitial pulmonary disease, unspecified: Secondary | ICD-10-CM | POA: Diagnosis not present

## 2023-08-25 LAB — PULMONARY FUNCTION TEST
DL/VA % pred: 116 %
DL/VA: 4.85 ml/min/mmHg/L
DLCO cor % pred: 94 %
DLCO cor: 17.9 ml/min/mmHg
DLCO unc % pred: 94 %
DLCO unc: 17.9 ml/min/mmHg
FEF 25-75 Pre: 2.05 L/s
FEF2575-%Pred-Pre: 105 %
FEV1-%Pred-Pre: 85 %
FEV1-Pre: 1.91 L
FEV1FVC-%Pred-Pre: 109 %
FEV6-%Pred-Pre: 81 %
FEV6-Pre: 2.29 L
FEV6FVC-%Pred-Pre: 104 %
FVC-%Pred-Pre: 77 %
FVC-Pre: 2.29 L
Pre FEV1/FVC ratio: 83 %
Pre FEV6/FVC Ratio: 100 %

## 2023-08-25 NOTE — Progress Notes (Signed)
Spirometry/Diffusion Capacity performed today.

## 2023-08-25 NOTE — Patient Instructions (Signed)
Spirometry/Diffusion Capacity performed today.

## 2023-08-31 DIAGNOSIS — Z8709 Personal history of other diseases of the respiratory system: Secondary | ICD-10-CM | POA: Diagnosis not present

## 2023-08-31 DIAGNOSIS — I700 Atherosclerosis of aorta: Secondary | ICD-10-CM | POA: Diagnosis not present

## 2023-08-31 DIAGNOSIS — R058 Other specified cough: Secondary | ICD-10-CM | POA: Diagnosis not present

## 2023-08-31 DIAGNOSIS — R059 Cough, unspecified: Secondary | ICD-10-CM | POA: Diagnosis not present

## 2023-08-31 DIAGNOSIS — R6889 Other general symptoms and signs: Secondary | ICD-10-CM | POA: Diagnosis not present

## 2023-08-31 DIAGNOSIS — R509 Fever, unspecified: Secondary | ICD-10-CM | POA: Diagnosis not present

## 2023-09-03 DIAGNOSIS — R509 Fever, unspecified: Secondary | ICD-10-CM | POA: Diagnosis not present

## 2023-09-03 DIAGNOSIS — R058 Other specified cough: Secondary | ICD-10-CM | POA: Diagnosis not present

## 2023-09-03 DIAGNOSIS — R0981 Nasal congestion: Secondary | ICD-10-CM | POA: Diagnosis not present

## 2023-09-25 ENCOUNTER — Encounter: Payer: Self-pay | Admitting: Gastroenterology

## 2023-09-25 ENCOUNTER — Ambulatory Visit: Payer: Medicare HMO | Admitting: Gastroenterology

## 2023-09-25 VITALS — BP 122/70 | HR 94 | Ht 63.0 in | Wt 246.0 lb

## 2023-09-25 DIAGNOSIS — Z860100 Personal history of colon polyps, unspecified: Secondary | ICD-10-CM | POA: Diagnosis not present

## 2023-09-25 DIAGNOSIS — K219 Gastro-esophageal reflux disease without esophagitis: Secondary | ICD-10-CM

## 2023-09-25 NOTE — Patient Instructions (Addendum)
 _______________________________________________________  If your blood pressure at your visit was 140/90 or greater, please contact your primary care physician to follow up on this.  _______________________________________________________  If you are age 69 or older, your body mass index should be between 23-30. Your Body mass index is 43.58 kg/m. If this is out of the aforementioned range listed, please consider follow up with your Primary Care Provider.  If you are age 32 or younger, your body mass index should be between 19-25. Your Body mass index is 43.58 kg/m. If this is out of the aformentioned range listed, please consider follow up with your Primary Care Provider.   ________________________________________________________  The [place] [facility] providers would like to encourage you to use MYCHART to communicate with providers for non-urgent requests or questions.  Due to long hold times on the telephone, sending your provider a message by [facility] may be a faster and more efficient way to get a response.  Please allow 48 business hours for a response.  Please remember that this is for non-urgent requests.  _______________________________________________________   We have given you samples of the following medication to take: Clenpiq lot num [id number] exp 09-14-24  You have been scheduled for a colonoscopy. Please follow written instructions given to you at your visit today.   If you use inhalers (even only as needed), please bring them with you on the day of your procedure.  DO NOT TAKE 7 DAYS PRIOR TO TEST- Trulicity (dulaglutide) Ozempic, Wegovy (semaglutide) Mounjaro (tirzepatide) Bydureon Bcise (exanatide extended release)  DO NOT TAKE 1 DAY PRIOR TO YOUR TEST Rybelsus (semaglutide) Adlyxin (lixisenatide) Victoza (liraglutide) Byetta (exanatide) ___________________________________________________________________________  Thank you,  Dr. Lajuan Pila

## 2023-09-25 NOTE — Progress Notes (Signed)
 Chief Complaint: FU  Referring Provider:  Tamela Fake, MD      ASSESSMENT AND PLAN;   #1. GERD.  Dysphagia has resolved after eso dil 2020.  #2. H/O polyps  Plan: -Continue omeprazole 20mg  po QD -colon (pl give prep)   HPI:    Judy Lucas is a 68 y.o. female  With L leg venous ulcer, HLD, HTN, morbid obesity, New Dx ILD (followed by [facility] pulm), pulm nodule, anxiety, OA, choline deficiency (can only tolerate Versed, not fentanyl), HOH, s/p cholecystectomy/appendicectomy/hysterectomy  Here to get established since Dr Randal Bury retd.  No nausea, vomiting, heartburn, regurgitation, odynophagia or dysphagia (resolved after dilatation).  No significant diarrhea or constipation.  No melena or hematochezia. No unintentional weight loss. No abdominal pain.  Occ diarrhea with lettuce/coleslaw since lap chole (over 20 years ago by Dr. Augie Leaven)    Past GI procedures:  S/P EGD with dil/colon Dr Randal Bury 2020 (records are awaited).  Repeat  colon in 5 years per patient  Colonoscopy 10/2015 ([facility]) -Colonic polyp s/p polypectomy -Predominantly sigmoid diverticulosis -Repeat in 5 years d/t FH of advanced polyps (mom at age 60)  Colonoscopy 06/2005 by Dr. Davy Estimable colonic diverticulosis.  Internal hemorrhoids.    SH- retd RN    Mom - had polyps- age 29s  Past Medical History:  Diagnosis Date   Abnormal CT of the chest 08/27/2019   Achilles tendinitis of left lower extremity 05/02/2022   Allergic rhinitis    Allergy    Anxiety    Chest discomfort 02/20/2019   Chest pain 01/22/2019   Choline deficiency    Can only tolerate versed for procedures   Diverticulosis    Dysphagia 01/22/2019   Heel spur, left 05/02/2022   Hyperlipidemia    Hypertension    Morbid obesity (HCC) 01/22/2019   Osteoarthritis 01/22/2019   Pharyngoesophageal dysphagia 01/22/2019   Last Assessment & Plan:  Formatting of this note might be different from the original. Concern over  dysphagia.  She has intermittent periods where she will have difficulty with swallowing.  They are very brief in nature.  She had an esophageal dilation in the past that temporarily helped.  She is on omeprazole once a day. EXAM shows normal oral cavity and oropharynx.  Neck is without adenopathy or   Pneumonia 05/2015   Renal insufficiency 01/22/2019   Sensorineural hearing loss (SNHL) of both ears 02/24/2021   Last Assessment & Plan:  Formatting of this note might be different from the original. Concern over her hearing. She has known sensorineural hearing loss bilaterally and wears hearing aids.  She recently was seen by her audiologist who noted something on testing that concerned her and was sent for evaluation.  In general, she is happy with her hearing with her hearing aids. EXAM shows normal exter   Tenosynovitis of left ankle 06/07/2022   Tenosynovitis of right ankle 06/07/2022   Tightness of heel cord, left 05/02/2022    Past Surgical History:  Procedure Laterality Date   ABDOMINAL HYSTERECTOMY     APPENDECTOMY     CHOLECYSTECTOMY     COLONOSCOPY  10/14/2015   Colonic polyp status post polypectomy. Predominantly sigmoid diverticulosis   COLONOSCOPY  05/31/2019   Dr Randal Bury  Benign neoplasm of cecum. Repeat in 5 years   ENDOVENOUS ABLATION SAPHENOUS VEIN W/ LASER Left 10/17/2019   endovenous laser ablation anterior accessory branch of left greater saphenous vein by Kirtland Perfect MD    ENDOVENOUS ABLATION SAPHENOUS VEIN W/ LASER Left 10/05/2022  endovenous laser ablation left small saphenous vein by Kirtland Perfect MD   ESOPHAGOGASTRODUODENOSCOPY  05/31/2019   Dr Randal Bury. There was no esophagitis. Multiple biopsies obtained from the esophagus to evaluate for eosinophilic esophagitis. Normal Z-Line. The EG junction did not distend well and had increased spasm. A wide bore stricture or ring could have been missed due to lack of distension but there was no obvious stricture.   feet surgery      WRIST SURGERY      Family History  Problem Relation Age of Onset   Hyperlipidemia Mother    Hypertension Mother    Heart disease Mother    Heart attack Mother    Skin cancer Father    Hyperlipidemia Father    Hypertension Father    Heart disease Father    Heart attack Father    Hypertension Sister    Hyperlipidemia Sister    Hyperlipidemia Brother     Social History   Tobacco Use   Smoking status: Never   Smokeless tobacco: Never  Vaping Use   Vaping status: Never Used  Substance Use Topics   Alcohol use: Never   Drug use: Never    Current Outpatient Medications  Medication Sig Dispense Refill   albuterol (VENTOLIN HFA) 108 (90 Base) MCG/ACT inhaler Inhale 2 puffs into the lungs every 4 (four) hours as needed for wheezing or shortness of breath.     aspirin EC 81 MG tablet Take 81 mg by mouth daily.     Cholecalciferol (VITAMIN D3) 50 MCG (2000 UT) capsule Take 2,000 Units by mouth daily.     diazepam  (VALIUM ) 5 MG tablet Take 1 tablet 30 minutes prior to leaving house on day of office surgery. 1 tablet 0   fluticasone (FLONASE) 50 MCG/ACT nasal spray Place 2 sprays into both nostrils daily.     loratadine (CLARITIN) 10 MG tablet Take 1 tablet by mouth daily.     meclizine (ANTIVERT) 12.5 MG tablet Take 12.5 mg by mouth 3 (three) times daily as needed for dizziness.     montelukast (SINGULAIR) 10 MG tablet Take 10 mg by mouth at bedtime.     olmesartan (BENICAR) 20 MG tablet Take 20 mg by mouth daily.     omeprazole (PRILOSEC) 20 MG capsule Take 20 mg by mouth daily.     oxybutynin (DITROPAN-XL) 10 MG 24 hr tablet Take 10 mg by mouth daily.     simvastatin (ZOCOR) 10 MG tablet Take 10 mg by mouth daily.     spironolactone (ALDACTONE) 50 MG tablet Take 25 mg by mouth at bedtime.     No current facility-administered medications for this visit.    Allergies  Allergen Reactions   Meperidine Shortness Of Breath   Morphine Shortness Of Breath   Yellow Dyes  (Non-Tartrazine) Anaphylaxis   Clindamycin Rash    Significant erythroderma with the itching associated with clindamycin.  No swelling, wheezing, dyspnea, or airway compromise.   Thyroid  Swelling   Furosemide Rash    SWELLING ALLERGY    Nitrofurantoin Rash   Nitroglycerin  Palpitations   Penicillin G Rash   Sulfamethoxazole Rash    Review of Systems:  Constitutional: Denies fever, chills, diaphoresis, appetite change and  has fatigue.  HEENT: Has allergies Respiratory: Denies SOB, DOE, cough, chest tightness,  and wheezing.   Cardiovascular: Denies chest pain, palpitations and leg swelling.  Genitourinary: Denies dysuria, urgency, frequency, hematuria, flank pain and difficulty urinating.  Musculoskeletal: Has myalgias, back pain, joint swelling, arthralgias and gait  problem.  Skin: No rash.  Neurological: Denies dizziness, seizures, syncope, weakness, light-headedness, numbness and headaches.  Hematological: Denies adenopathy. Easy bruising, personal or family bleeding history  Psychiatric/Behavioral: Has anxiety or depression     Physical Exam:    BP 122/70   Pulse 94   Ht 5\' 3"  (1.6 m)   Wt 246 lb (111.6 kg)   BMI 43.58 kg/m  Wt Readings from Last 3 Encounters:  09/25/23 246 lb (111.6 kg)  08/25/23 246 lb (111.6 kg)  04/18/23 241 lb 12.8 oz (109.7 kg)   Constitutional:  Well-developed, in no acute distress. Psychiatric: Normal mood and affect. Behavior is normal. HEENT: Pupils normal.  Conjunctivae are normal. No scleral icterus. Neck supple.  Cardiovascular: Normal rate, regular rhythm. No edema Pulmonary/chest: Effort normal and breath sounds normal. No wheezing, rales or rhonchi. Abdominal: Soft, nondistended. Nontender. Bowel sounds active throughout. There are no masses palpable. No hepatomegaly. Rectal: Deferred Neurological: Alert and oriented to person place and time. Skin: Skin is warm and dry. No rashes noted.  Data Reviewed: I have personally reviewed  following labs and imaging studies  CBC:    Latest Ref Rng & Units 01/22/2019    4:43 PM  CBC  WBC 3.4 - 10.8 x10E3/uL 6.3   Hemoglobin 11.1 - 15.9 g/dL 40.9   Hematocrit 81.1 - 46.6 % 32.5   Platelets 150 - 450 x10E3/uL 213     CMP:    Latest Ref Rng & Units 01/22/2019    4:43 PM  CMP  Glucose 65 - 99 mg/dL 94   BUN 8 - 27 mg/dL 20   Creatinine 9.14 - 1.00 mg/dL 7.82   Sodium 956 - 213 mmol/L 139   Potassium 3.5 - 5.2 mmol/L 4.3   Chloride 96 - 106 mmol/L 103   CO2 20 - 29 mmol/L 18   Calcium 8.7 - 10.3 mg/dL 9.2         Magnus Schuller, MD 09/25/2023, 2:04 PM  Cc: Tamela Fake, MD

## 2023-09-26 ENCOUNTER — Encounter: Payer: Self-pay | Admitting: Gastroenterology

## 2023-10-04 ENCOUNTER — Ambulatory Visit (AMBULATORY_SURGERY_CENTER): Payer: Medicare HMO | Admitting: Gastroenterology

## 2023-10-04 ENCOUNTER — Encounter: Payer: Self-pay | Admitting: Gastroenterology

## 2023-10-04 VITALS — BP 128/72 | HR 61 | Temp 97.0°F | Resp 14 | Ht 66.0 in | Wt 246.0 lb

## 2023-10-04 DIAGNOSIS — I10 Essential (primary) hypertension: Secondary | ICD-10-CM | POA: Diagnosis not present

## 2023-10-04 DIAGNOSIS — K640 First degree hemorrhoids: Secondary | ICD-10-CM

## 2023-10-04 DIAGNOSIS — Z1211 Encounter for screening for malignant neoplasm of colon: Secondary | ICD-10-CM

## 2023-10-04 DIAGNOSIS — K644 Residual hemorrhoidal skin tags: Secondary | ICD-10-CM | POA: Diagnosis not present

## 2023-10-04 DIAGNOSIS — F419 Anxiety disorder, unspecified: Secondary | ICD-10-CM | POA: Diagnosis not present

## 2023-10-04 DIAGNOSIS — Z860100 Personal history of colon polyps, unspecified: Secondary | ICD-10-CM | POA: Diagnosis not present

## 2023-10-04 DIAGNOSIS — K5730 Diverticulosis of large intestine without perforation or abscess without bleeding: Secondary | ICD-10-CM

## 2023-10-04 MED ORDER — SODIUM CHLORIDE 0.9 % IV SOLN: 500.0000 mL | Freq: Once | INTRAVENOUS | Status: DC

## 2023-10-04 NOTE — Progress Notes (Signed)
 Vitals-DT  Pt's states no medical or surgical changes since previsit or office visit.

## 2023-10-04 NOTE — Patient Instructions (Signed)
Resume previous diet and medications. No repeat Colonoscopy recommended. Handouts provided on Diverticulosis and Hemorrhoids  YOU HAD AN ENDOSCOPIC PROCEDURE TODAY AT THE [facility]:   Refer to the procedure report that was given to you for any specific questions about what was found during the examination.  If the procedure report does not answer your questions, please call your gastroenterologist to clarify.  If you requested that your care partner not be given the details of your procedure findings, then the procedure report has been included in a sealed envelope for you to review at your convenience later.  YOU SHOULD EXPECT: Some feelings of bloating in the abdomen. Passage of more gas than usual.  Walking can help get rid of the air that was put into your GI tract during the procedure and reduce the bloating. If you had a lower endoscopy (such as a colonoscopy or flexible sigmoidoscopy) you may notice spotting of blood in your stool or on the toilet paper. If you underwent a bowel prep for your procedure, you may not have a normal bowel movement for a few days.  Please Note:  You might notice some irritation and congestion in your nose or some drainage.  This is from the oxygen used during your procedure.  There is no need for concern and it should clear up in a day or so.  SYMPTOMS TO REPORT IMMEDIATELY:  Following lower endoscopy (colonoscopy or flexible sigmoidoscopy):  Excessive amounts of blood in the stool  Significant tenderness or worsening of abdominal pains  Swelling of the abdomen that is new, acute  Fever of 100F or higher  For urgent or emergent issues, a gastroenterologist can be reached at any hour by calling [phone]. Do not use MyChart messaging for urgent concerns.    DIET:  We do recommend a small meal at first, but then you may proceed to your regular diet.  Drink plenty of fluids but you should avoid alcoholic beverages for 24 hours.  ACTIVITY:   You should plan to take it easy for the rest of today and you should NOT DRIVE or use heavy machinery until tomorrow (because of the sedation medicines used during the test).    FOLLOW UP: Our staff will call the number listed on your records the next business day following your procedure.  We will call around 7:15- 8:00 am to check on you and address any questions or concerns that you may have regarding the information given to you following your procedure. If we do not reach you, we will leave a message.     If any biopsies were taken you will be contacted by phone or by letter within the next 1-3 weeks.  Please call us at [phone] if you have not heard about the biopsies in 3 weeks.    SIGNATURES/CONFIDENTIALITY: You and/or your care partner have signed paperwork which will be entered into your electronic medical record.  These signatures attest to the fact that that the information above on your After Visit Summary has been reviewed and is understood.  Full responsibility of the confidentiality of this discharge information lies with you and/or your care-partner.

## 2023-10-04 NOTE — Op Note (Signed)
[facility] Patient Name: Judy Lucas Procedure Date: 10/04/2023 8:49 AM MRN: [id number] Endoscopist: Lynann Bologna , MD, [id number] Age: 68 Referring MD:  Date of Birth: 1956-05-16 Gender: Female Account #: [id number] Procedure:                Colonoscopy Indications:              High risk colon cancer surveillance: Personal                            history of colonic polyps Medicines:                Monitored Anesthesia Care Procedure:                Pre-Anesthesia Assessment:                           - Prior to the procedure, a History and Physical                            was performed, and patient medications and                            allergies were reviewed. The patient's tolerance of                            previous anesthesia was also reviewed. The risks                            and benefits of the procedure and the sedation                            options and risks were discussed with the patient.                            All questions were answered, and informed consent                            was obtained. Prior Anticoagulants: The patient has                            taken no anticoagulant or antiplatelet agents. ASA                            Grade Assessment: II - A patient with mild systemic                            disease. After reviewing the risks and benefits,                            the patient was deemed in satisfactory condition to                            undergo the procedure.  After obtaining informed consent, the colonoscope                            was passed under direct vision. Throughout the                            procedure, the patient's blood pressure, pulse, and                            oxygen saturations were monitored continuously. The                            Olympus Scope CF SN [id number] was introduced through                            the anus and advanced to the the  cecum, identified                            by appendiceal orifice and ileocecal valve. The                            colonoscopy was performed without difficulty. The                            patient tolerated the procedure well. The quality                            of the bowel preparation was adequate to identify                            polyps. There was retained stool specially in the                            right side of the colon and some areas of the                            sigmoid colon which could not be fully washed.                            Nevertheless, aggressive suctioning and aspiration                            was performed. Overall, over 85 to 90% of the                            colonic mucosa was visualized satisfactorily. The                            colon was somewhat redundant. Passage of scope was                            assisted by abdominal pressure. The ileocecal  valve, appendiceal orifice, and rectum were                            photographed. Scope In: 9:27:22 AM Scope Out: 9:44:16 AM Scope Withdrawal Time: 0 hours 11 minutes 16 seconds  Total Procedure Duration: 0 hours 16 minutes 54 seconds  Findings:                 Multiple medium-mouthed diverticula were found in                            the sigmoid colon, few in descending colon and                            ascending colon.                           Non-bleeding external and internal hemorrhoids were                            found during retroflexion and during perianal exam.                            The hemorrhoids were small and Grade I (internal                            hemorrhoids that do not prolapse).                           The exam was otherwise without abnormality on                            direct and retroflexion views. Complications:            No immediate complications. Estimated Blood Loss:     Estimated blood loss:  none. Impression:               - Pancolonic diverticulosis predominantly in the                            sigmoid colon.                           - Non-bleeding external and internal hemorrhoids.                           - The examination was otherwise normal on direct                            and retroflexion views.                           - No specimens collected. Recommendation:           - Patient has a contact number available for                            emergencies. The signs and symptoms  of potential                            delayed complications were discussed with the                            patient. Return to normal activities tomorrow.                            Written discharge instructions were provided to the                            patient.                           - High fiber diet.                           - Continue present medications.                           - Repeat colonoscopy is not recommended for                            screening purposes. Hence, repeat colonoscopy only                            if with any new problems.                           - The findings and recommendations were discussed                            with the patient's family. Lynann Bologna, MD 10/04/2023 9:49:33 AM This report has been signed electronically.

## 2023-10-04 NOTE — Progress Notes (Signed)
[facility] History and Physical   Primary Care Physician:  Hadley Pen, MD   Reason for Procedure:   H/O polyps  Plan:    colon     HPI: Judy Lucas is a 68 y.o. female    Past Medical History:  Diagnosis Date   Abnormal CT of the chest 08/27/2019   Achilles tendinitis of left lower extremity 05/02/2022   Allergic rhinitis    Allergy    Anxiety    Chest discomfort 02/20/2019   Chest pain 01/22/2019   Choline deficiency    Can only tolerate versed for procedures   Diverticulosis    Dysphagia 01/22/2019   Heel spur, left 05/02/2022   Hyperlipidemia    Hypertension    Morbid obesity (HCC) 01/22/2019   Osteoarthritis 01/22/2019   Pharyngoesophageal dysphagia 01/22/2019   Last Assessment & Plan:  Formatting of this note might be different from the original. Concern over dysphagia.  She has intermittent periods where she will have difficulty with swallowing.  They are very brief in nature.  She had an esophageal dilation in the past that temporarily helped.  She is on omeprazole once a day. EXAM shows normal oral cavity and oropharynx.  Neck is without adenopathy or   Pneumonia 05/2015   Renal insufficiency 01/22/2019   Sensorineural hearing loss (SNHL) of both ears 02/24/2021   Last Assessment & Plan:  Formatting of this note might be different from the original. Concern over her hearing. She has known sensorineural hearing loss bilaterally and wears hearing aids.  She recently was seen by her audiologist who noted something on testing that concerned her and was sent for evaluation.  In general, she is happy with her hearing with her hearing aids. EXAM shows normal exter   Tenosynovitis of left ankle 06/07/2022   Tenosynovitis of right ankle 06/07/2022   Tightness of heel cord, left 05/02/2022    Past Surgical History:  Procedure Laterality Date   ABDOMINAL HYSTERECTOMY     APPENDECTOMY     CHOLECYSTECTOMY     COLONOSCOPY  10/14/2015   Colonic  polyp status post polypectomy. Predominantly sigmoid diverticulosis   COLONOSCOPY  05/31/2019   Dr Charm Barges  Benign neoplasm of cecum. Repeat in 5 years   ENDOVENOUS ABLATION SAPHENOUS VEIN W/ LASER Left 10/17/2019   endovenous laser ablation anterior accessory branch of left greater saphenous vein by Cari Caraway MD    ENDOVENOUS ABLATION SAPHENOUS VEIN W/ LASER Left 10/05/2022   endovenous laser ablation left small saphenous vein by Cari Caraway MD   ESOPHAGOGASTRODUODENOSCOPY  05/31/2019   Dr Charm Barges. There was no esophagitis. Multiple biopsies obtained from the esophagus to evaluate for eosinophilic esophagitis. Normal Z-Line. The EG junction did not distend well and had increased spasm. A wide bore stricture or ring could have been missed due to lack of distension but there was no obvious stricture.   feet surgery     WRIST SURGERY      Prior to Admission medications   Medication Sig Start Date End Date Taking? Authorizing Provider  Cholecalciferol (VITAMIN D3) 50 MCG (2000 UT) capsule Take 2,000 Units by mouth daily.   Yes [provider]  fluticasone (FLONASE) 50 MCG/ACT nasal spray Place 2 sprays into both nostrils daily. 09/18/17  Yes [provider]  loratadine (CLARITIN) 10 MG tablet Take 1 tablet by mouth daily. 01/26/18  Yes [provider]  montelukast (SINGULAIR) 10 MG tablet Take 10 mg by mouth at bedtime.   Yes [provider]  olmesartan (BENICAR) 20 MG tablet Take 20 mg by mouth daily. 04/26/22  Yes [provider]  omeprazole (PRILOSEC) 20 MG capsule Take 20 mg by mouth daily. 07/26/21  Yes [provider]  oxybutynin (DITROPAN-XL) 10 MG 24 hr tablet Take 10 mg by mouth daily. 09/01/21  Yes [provider]  simvastatin (ZOCOR) 10 MG tablet Take 10 mg by mouth daily.   Yes [provider]  spironolactone (ALDACTONE) 50 MG tablet Take 25 mg by mouth at bedtime. 07/13/21  Yes [provider]  albuterol  (VENTOLIN HFA) 108 (90 Base) MCG/ACT inhaler Inhale 2 puffs into the lungs every 4 (four) hours as needed for wheezing or shortness of breath.    [provider]  aspirin EC 81 MG tablet Take 81 mg by mouth daily.    [provider]  diazepam (VALIUM) 5 MG tablet Take 1 tablet 30 minutes prior to leaving house on day of office surgery. 09/27/22   Chuck Hint, MD  meclizine (ANTIVERT) 12.5 MG tablet Take 12.5 mg by mouth 3 (three) times daily as needed for dizziness.    [provider]    Current Outpatient Medications  Medication Sig Dispense Refill   Cholecalciferol (VITAMIN D3) 50 MCG (2000 UT) capsule Take 2,000 Units by mouth daily.     fluticasone (FLONASE) 50 MCG/ACT nasal spray Place 2 sprays into both nostrils daily.     loratadine (CLARITIN) 10 MG tablet Take 1 tablet by mouth daily.     montelukast (SINGULAIR) 10 MG tablet Take 10 mg by mouth at bedtime.     olmesartan (BENICAR) 20 MG tablet Take 20 mg by mouth daily.     omeprazole (PRILOSEC) 20 MG capsule Take 20 mg by mouth daily.     oxybutynin (DITROPAN-XL) 10 MG 24 hr tablet Take 10 mg by mouth daily.     simvastatin (ZOCOR) 10 MG tablet Take 10 mg by mouth daily.     spironolactone (ALDACTONE) 50 MG tablet Take 25 mg by mouth at bedtime.     albuterol (VENTOLIN HFA) 108 (90 Base) MCG/ACT inhaler Inhale 2 puffs into the lungs every 4 (four) hours as needed for wheezing or shortness of breath.     aspirin EC 81 MG tablet Take 81 mg by mouth daily.     diazepam (VALIUM) 5 MG tablet Take 1 tablet 30 minutes prior to leaving house on day of office surgery. 1 tablet 0   meclizine (ANTIVERT) 12.5 MG tablet Take 12.5 mg by mouth 3 (three) times daily as needed for dizziness.     Current Facility-Administered Medications  Medication Dose Route Frequency Provider Last Rate Last Admin   0.9 %  sodium chloride infusion  500 mL Intravenous Once Lynann Bologna, MD        Allergies as of 10/04/2023 -  Review Complete 10/04/2023  Allergen Reaction Noted   Meperidine Shortness Of Breath 11/28/2017   Morphine Shortness Of Breath 11/28/2017   Sulfamethoxazole Rash 11/28/2017   Thyroid Swelling 11/28/2017   Yellow dyes (non-tartrazine) Anaphylaxis 01/22/2019   Clindamycin Rash 06/10/2022   Furosemide Rash 01/16/2018   Nitrofurantoin Rash 02/13/2021   Nitroglycerin Palpitations 11/28/2017   Penicillin g Rash 11/28/2017    Family History  Problem Relation Age of Onset   Hyperlipidemia Mother    Hypertension Mother    Heart disease Mother    Heart attack Mother    Skin cancer Father    Hyperlipidemia Father    Hypertension Father  Heart disease Father    Heart attack Father    Hypertension Sister    Hyperlipidemia Sister    Hyperlipidemia Brother     Social History   Socioeconomic History   Marital status: Married    Spouse name: Not on file   Number of children: Not on file   Years of education: Not on file   Highest education level: Not on file  Occupational History   Not on file  Tobacco Use   Smoking status: Never   Smokeless tobacco: Never  Vaping Use   Vaping status: Never Used  Substance and Sexual Activity   Alcohol use: Never   Drug use: Never   Sexual activity: Not on file  Other Topics Concern   Not on file  Social History Narrative   Not on file   Social Drivers of Health   Financial Resource Strain: Not on file  Food Insecurity: Not on file  Transportation Needs: Not on file  Physical Activity: Not on file  Stress: Not on file  Social Connections: Not on file  Intimate Partner Violence: Not on file    Review of Systems: Positive for none All other review of systems negative except as mentioned in the HPI.  Physical Exam: Vital signs in last 24 hours: @VSRANGES @   General:   Alert,  Well-developed, well-nourished, pleasant and cooperative in NAD Lungs:  Clear throughout to auscultation.   Heart:  Regular rate and rhythm; no murmurs,  clicks, rubs,  or gallops. Abdomen:  Soft, nontender and nondistended. Normal bowel sounds.   Neuro/Psych:  Alert and cooperative. Normal mood and affect. A and O x 3    No significant changes were identified.  The patient continues to be an appropriate candidate for the planned procedure and anesthesia.   Edman Circle, MD. [facility] 10/04/2023 8:52 AM@

## 2023-10-04 NOTE — Progress Notes (Signed)
 Report to PACU, RN, vss, BBS= Clear.

## 2023-10-05 ENCOUNTER — Telehealth: Payer: Self-pay

## 2023-10-05 NOTE — Telephone Encounter (Signed)
  Follow up Call-     10/04/2023    8:35 AM 10/04/2023    8:27 AM  Call back number  Post procedure Call Back phone  # [phone]   Permission to leave phone message  Yes     Patient questions:  Do you have a fever, pain , or abdominal swelling? No. Pain Score  0 *  Have you tolerated food without any problems? Yes.    Have you been able to return to your normal activities? Yes.    Do you have any questions about your discharge instructions: Diet   No. Medications  No. Follow up visit  No.  Do you have questions or concerns about your Care? No.  Actions: * If pain score is 4 or above: No action needed, pain <4.

## 2023-10-30 DIAGNOSIS — M19071 Primary osteoarthritis, right ankle and foot: Secondary | ICD-10-CM | POA: Diagnosis not present

## 2023-10-30 DIAGNOSIS — M19072 Primary osteoarthritis, left ankle and foot: Secondary | ICD-10-CM | POA: Diagnosis not present

## 2023-10-31 DIAGNOSIS — I10 Essential (primary) hypertension: Secondary | ICD-10-CM | POA: Diagnosis not present

## 2023-10-31 DIAGNOSIS — Z0000 Encounter for general adult medical examination without abnormal findings: Secondary | ICD-10-CM | POA: Diagnosis not present

## 2023-10-31 DIAGNOSIS — E782 Mixed hyperlipidemia: Secondary | ICD-10-CM | POA: Diagnosis not present

## 2023-12-18 DIAGNOSIS — Z1231 Encounter for screening mammogram for malignant neoplasm of breast: Secondary | ICD-10-CM | POA: Diagnosis not present

## 2023-12-21 DIAGNOSIS — M21969 Unspecified acquired deformity of unspecified lower leg: Secondary | ICD-10-CM | POA: Diagnosis not present

## 2023-12-21 DIAGNOSIS — M25571 Pain in right ankle and joints of right foot: Secondary | ICD-10-CM | POA: Diagnosis not present

## 2023-12-21 DIAGNOSIS — M19071 Primary osteoarthritis, right ankle and foot: Secondary | ICD-10-CM | POA: Diagnosis not present

## 2023-12-28 DIAGNOSIS — H524 Presbyopia: Secondary | ICD-10-CM | POA: Diagnosis not present

## 2024-01-05 ENCOUNTER — Other Ambulatory Visit

## 2024-01-15 ENCOUNTER — Ambulatory Visit: Payer: Self-pay

## 2024-01-15 NOTE — Telephone Encounter (Signed)
  Chief Complaint: Routine Appt Additional Notes: Pt calling to schedule routine F/U appt with primary pulmonologist in January 2026-- pt notified that schedule not available to book yet, and was added to waitlist. Pt also advised to call back in a few months to check if schedule is open. Patient verbalized understanding and to call back PRN.    Reason for Disposition  Requesting regular office appointment  Answer Assessment - Initial Assessment Questions 1. REASON FOR CALL or QUESTION: "What is your reason for calling today?" or "How can I best help you?" or "What question do you have that I can help answer?"     Pt initially calling to schedule F/U appt with primary pulmonologist. PAS attempted to transfer to NT d/t stating issues with upper L chest area. Upon further investigation, this is baseline for pt and pt was wanting to correlate Pulm F/U with scheduled surgery in January 2026, as she thinks she is due for imaging of lungs.  Pt denies any acute sx at this time. [facility] notified pt that schedule is not open in 2026, but will be added to a wait list.  Protocols used: Information Only Call - No Triage-A-AH

## 2024-01-15 NOTE — Telephone Encounter (Signed)
 Copied from CRM [id number]. Topic: Clinical - Red Word Triage >> Jan 15, 2024  4:39 PM Chantha C wrote: Red Word that prompted transfer to Nurse Triage: Patient [phone] states has an CT Scan for 08/19/24 and needs to have an follow up appointment with Dr. Bertrum Brodie for January 2026. Patient will have ankle surgery third week of January 2026, and will not be able to go the doctor office. Informed patient, Dr. Bertrum Brodie 2026 scheduled is not available and patient can call back in 2-3 months before appointment schedule is due.   Patient states having problems with upper left chest area, sharp pain comes and go not new symptoms, it's an ongoing issue and denies shortness of breath. Patient currently has the shingles now on right hip. Please advise. >> Jan 15, 2024  4:55 PM Chantha C wrote: Patient states she cannot wait any longer on the phone and have the nurse call patient back. Patient states her main concern is about the appointment with Dr. Bertrum Brodie.

## 2024-01-17 DIAGNOSIS — B029 Zoster without complications: Secondary | ICD-10-CM | POA: Diagnosis not present

## 2024-01-17 NOTE — Progress Notes (Signed)
 [facility] [place] Patient Name:  Judy Lucas (DOB: [birth year]) MRN:  [id number] Appt Date:  01/17/2024    CHIEF COMPLAINT   Rash (Pt reports shingles flare up on right hip )   ASSESSMENT AND PLAN   Assessment/Plan Diagnoses and all orders for this visit: 1. Herpes zoster without complication -     acyclovir (ZOVIRAX) 800 mg tablet; Take 1 tablet (800 mg total) by mouth 5 (five) times a day for 7 days.  Dispense: 35 tablet; Refill: 0  MDM Number of Diagnoses or Management Options Diagnosis management comments: This is a 68 year old female who presents with shingles rash to the right hip.  Medicines called into the pharmacy. The patient is nontoxic and in no distress.  Patient advised to follow up with the primary care provider.  Patient is to go to the ER for any new, changing, worsening symptoms or for any concerns.  Discussed this visit with patient in detail and answered any questions.  Patient agrees with the plan and is comfortable going home.      HISTORY OF PRESENT ILLNESS   Subjective  This is a 68 year old female who presents with a shingles flare-up on the right buttock x3 days.  She states she has had this in the past.  She states that a week ago she started not feeling well and do something was going to happen.  She states that the pain hurts deep into the bone.  She only has a couple lesions on the but she states.  No fevers or chills.  No nausea/vomiting/diarrhea.  States acyclovir works really well for her and clears it up with after few pills.   Rash Pertinent negatives include no congestion, cough, diarrhea, fatigue, fever, rhinorrhea, shortness of breath or vomiting.    REVIEW OF SYSTEMS   Review of Systems  Constitutional:  Negative for chills, fatigue and fever.  HENT:  Negative for congestion, ear pain, nosebleeds, rhinorrhea, trouble swallowing and voice change.   Respiratory:  Negative for cough, shortness of breath and stridor.    Cardiovascular:  Negative for leg swelling.  Gastrointestinal:  Negative for constipation, diarrhea, nausea and vomiting.  Genitourinary:  Negative for difficulty urinating.  Musculoskeletal:  Negative for arthralgias, back pain, gait problem, myalgias, neck pain and neck stiffness.  Skin:  Positive for rash.    PHYSICAL EXAMINATION   Objective Vitals:   01/17/24 0813  BP: 128/66  BP Location: Right arm  Patient Position: Sitting  Pulse: (!) 57  Resp: 17  Temp: 36.4 C (97.6 F)  TempSrc: Skin  SpO2: 100%  Weight: 250 lb (113 kg)  Height: 5' 2 (1.575 m)   Physical Exam Vitals and nursing note reviewed.  Constitutional:      Appearance: Normal appearance. She is not ill-appearing.  HENT:     Head: Normocephalic and atraumatic.     Right Ear: Tympanic membrane, ear canal and external ear normal.     Left Ear: Tympanic membrane, ear canal and external ear normal.     Nose: No congestion or rhinorrhea.     Mouth/Throat:     Mouth: Mucous membranes are moist.     Pharynx: Oropharynx is clear. No oropharyngeal exudate or posterior oropharyngeal erythema.  Eyes:     Extraocular Movements: Extraocular movements intact.     Conjunctiva/sclera: Conjunctivae normal.     Pupils: Pupils are equal, round, and reactive to light.  Cardiovascular:     Rate and Rhythm: Normal rate and regular rhythm.  Pulses: Normal pulses.  Pulmonary:     Effort: No respiratory distress.     Breath sounds: No stridor. No wheezing, rhonchi or rales.  Chest:     Chest wall: No tenderness.  Abdominal:     Tenderness: There is no abdominal tenderness.     Hernia: No hernia is present.  Musculoskeletal:     Cervical back: Normal range of motion.  Skin:    Findings: Rash (Vesicles to the lower right buttock and a cluster with no drainage, scabbing, erythema, streaking) present.  Neurological:     Mental Status: She is alert.        Jamie Lea Speight, PA-C

## 2024-01-25 DIAGNOSIS — M19072 Primary osteoarthritis, left ankle and foot: Secondary | ICD-10-CM | POA: Diagnosis not present

## 2024-01-25 DIAGNOSIS — M19071 Primary osteoarthritis, right ankle and foot: Secondary | ICD-10-CM | POA: Diagnosis not present

## 2024-03-22 ENCOUNTER — Telehealth: Payer: Self-pay

## 2024-03-22 ENCOUNTER — Other Ambulatory Visit: Payer: Self-pay | Admitting: Vascular Surgery

## 2024-03-22 DIAGNOSIS — I872 Venous insufficiency (chronic) (peripheral): Secondary | ICD-10-CM

## 2024-03-22 DIAGNOSIS — L97329 Non-pressure chronic ulcer of left ankle with unspecified severity: Secondary | ICD-10-CM

## 2024-03-22 NOTE — Telephone Encounter (Addendum)
 Patient called c/o returning ulcer above her left inner.  She reports this is the same kind of sore that was present before her laser ablation by Dr Eliza (See photo from 09/14/22). Pt reports she wears compression stockings and elevates her leg above her heart regularly, which she reports helps the swelling and pain.  She reports the pain is worse at night and throbs.  1153: Called patient back to advise it may be several weeks to months to get her scheduled.  Advised her to contact her PCP if she needs referral to wound care.

## 2024-04-02 DIAGNOSIS — R600 Localized edema: Secondary | ICD-10-CM | POA: Diagnosis not present

## 2024-04-02 DIAGNOSIS — I82812 Embolism and thrombosis of superficial veins of left lower extremities: Secondary | ICD-10-CM | POA: Diagnosis not present

## 2024-04-03 DIAGNOSIS — I82812 Embolism and thrombosis of superficial veins of left lower extremities: Secondary | ICD-10-CM | POA: Diagnosis not present

## 2024-04-12 ENCOUNTER — Other Ambulatory Visit: Payer: Self-pay

## 2024-04-16 ENCOUNTER — Encounter: Payer: Self-pay | Admitting: Cardiology

## 2024-04-16 ENCOUNTER — Ambulatory Visit: Attending: Cardiology | Admitting: Cardiology

## 2024-04-16 VITALS — BP 142/88 | HR 51 | Ht 62.0 in | Wt 247.6 lb

## 2024-04-16 DIAGNOSIS — Z86718 Personal history of other venous thrombosis and embolism: Secondary | ICD-10-CM | POA: Insufficient documentation

## 2024-04-16 DIAGNOSIS — I10 Essential (primary) hypertension: Secondary | ICD-10-CM | POA: Diagnosis not present

## 2024-04-16 DIAGNOSIS — I700 Atherosclerosis of aorta: Secondary | ICD-10-CM | POA: Diagnosis not present

## 2024-04-16 DIAGNOSIS — E782 Mixed hyperlipidemia: Secondary | ICD-10-CM

## 2024-04-16 NOTE — Patient Instructions (Signed)
Medication Instructions:  Your physician recommends that you continue on your current medications as directed. Please refer to the Current Medication list given to you today.  *If you need a refill on your cardiac medications before your next appointment, please call your pharmacy*   Lab Work: Your physician recommends that you have a BMP today in the office.  If you have labs (blood work) drawn today and your tests are completely normal, you will receive your results only by: MyChart Message (if you have MyChart) OR A paper copy in the mail If you have any lab test that is abnormal or we need to change your treatment, we will call you to review the results.   Testing/Procedures: None ordered   Follow-Up: At [facility], you and your health needs are our priority.  As part of our continuing mission to provide you with exceptional heart care, we have created designated Provider Care Teams.  These Care Teams include your primary Cardiologist (physician) and Advanced Practice Providers (APPs -  Physician Assistants and Nurse Practitioners) who all work together to provide you with the care you need, when you need it.  We recommend signing up for the patient portal called "MyChart".  Sign up information is provided on this After Visit Summary.  MyChart is used to connect with patients for Virtual Visits (Telemedicine).  Patients are able to view lab/test results, encounter notes, upcoming appointments, etc.  Non-urgent messages can be sent to your provider as well.   To learn more about what you can do with MyChart, go to [email or web address].    Your next appointment:   9 month(s)  The format for your next appointment:   In Person  Provider:   Belva Crome, MD    Other Instructions none  Important Information About Sugar

## 2024-04-16 NOTE — Progress Notes (Signed)
 Cardiology Office Note:    Date:  04/16/2024   ID:  Judy Lucas, DOB [birth year], MRN [id number]  PCP:  Silver Lamar LABOR, MD  Cardiologist:  Jennifer JONELLE Crape, MD   Referring MD: Silver Lamar LABOR, MD    ASSESSMENT:    1. Mixed hyperlipidemia   2. Morbid obesity (HCC)   3. Primary hypertension   4. Aortic atherosclerosis (HCC)   5. History of DVT (deep vein thrombosis)    PLAN:    In order of problems listed above:  Primary prevention stressed with the patient.  Importance of compliance with diet medication stressed and patient verbalized standing. Aortic atherosclerosis: Stable and patient is aware of this. Mixed dyslipidemia: On lipid-lowering medications.  Goal LDL is 60.  Lipids were reviewed and diet was emphasized. Essential hypertension: Stable blood pressure issues were discussed.  Diet emphasized.  She will have a Chem-7 today. History of DVT: On medications followed by primary care.  She is taking Eliquis. Morbid obesity: Weight reduction stressed diet emphasized and she promises to do better. Patient will be seen in follow-up appointment in 9 months or earlier if the patient has any concerns.    Medication Adjustments/Labs and Tests Ordered: Current medicines are reviewed at length with the patient today.  Concerns regarding medicines are outlined above.  Orders Placed This Encounter  Procedures   Basic metabolic panel with GFR   EKG 87-Ozji   No orders of the defined types were placed in this encounter.    No chief complaint on file.    History of Present Illness:    Judy Lucas is a 68 y.o. female.  Patient has past medical history of essential hypertension, mixed dyslipidemia, morbid obesity, aortic atherosclerosis and recently has history of DVT.  She has a wound in her lower extremity over the ankle and she plans to see wound doctor for this.  She denies any chest pain orthopnea or PND.  At the time of my evaluation, the patient is alert awake  oriented and in no distress.  Past Medical History:  Diagnosis Date   Abnormal CT of the chest 08/27/2019   Achilles tendinitis of left lower extremity 05/02/2022   Allergic rhinitis    Allergy    Anxiety    Chest discomfort 02/20/2019   Chest pain 01/22/2019   Choline deficiency    Can only tolerate versed for procedures   Diverticulosis    Dysphagia 01/22/2019   Heel spur, left 05/02/2022   Hyperlipidemia    Hypertension    Morbid obesity (HCC) 01/22/2019   Osteoarthritis 01/22/2019   Pharyngoesophageal dysphagia 01/22/2019   Last Assessment & Plan:  Formatting of this note might be different from the original. Concern over dysphagia.  She has intermittent periods where she will have difficulty with swallowing.  They are very brief in nature.  She had an esophageal dilation in the past that temporarily helped.  She is on omeprazole once a day. EXAM shows normal oral cavity and oropharynx.  Neck is without adenopathy or   Pneumonia 05/2015   Renal insufficiency 01/22/2019   Sensorineural hearing loss (SNHL) of both ears 02/24/2021   Last Assessment & Plan:  Formatting of this note might be different from the original. Concern over her hearing. She has known sensorineural hearing loss bilaterally and wears hearing aids.  She recently was seen by her audiologist who noted something on testing that concerned her and was sent for evaluation.  In general, she is happy with her hearing with  her hearing aids. EXAM shows normal exter   Tenosynovitis of left ankle 06/07/2022   Tenosynovitis of right ankle 06/07/2022   Tightness of heel cord, left 05/02/2022    Past Surgical History:  Procedure Laterality Date   ABDOMINAL HYSTERECTOMY     APPENDECTOMY     CHOLECYSTECTOMY     COLONOSCOPY  10/14/2015   Colonic polyp status post polypectomy. Predominantly sigmoid diverticulosis   COLONOSCOPY  05/31/2019   Dr Towana  Benign neoplasm of cecum. Repeat in 5 years   ENDOVENOUS ABLATION  SAPHENOUS VEIN W/ LASER Left 10/17/2019   endovenous laser ablation anterior accessory branch of left greater saphenous vein by Medford Blade MD    ENDOVENOUS ABLATION SAPHENOUS VEIN W/ LASER Left 10/05/2022   endovenous laser ablation left small saphenous vein by Medford Blade MD   ESOPHAGOGASTRODUODENOSCOPY  05/31/2019   Dr Towana. There was no esophagitis. Multiple biopsies obtained from the esophagus to evaluate for eosinophilic esophagitis. Normal Z-Line. The EG junction did not distend well and had increased spasm. A wide bore stricture or ring could have been missed due to lack of distension but there was no obvious stricture.   feet surgery     WRIST SURGERY      Current Medications: Current Meds  Medication Sig   albuterol (VENTOLIN HFA) 108 (90 Base) MCG/ACT inhaler Inhale 2 puffs into the lungs every 4 (four) hours as needed for wheezing or shortness of breath.   aspirin EC 81 MG tablet Take 81 mg by mouth daily.   Cholecalciferol (VITAMIN D3) 50 MCG (2000 UT) capsule Take 2,000 Units by mouth daily.   diazepam  (VALIUM ) 5 MG tablet Take 1 tablet 30 minutes prior to leaving house on day of office surgery.   ELIQUIS 5 MG TABS tablet Take 5 mg by mouth 2 (two) times daily.   fluticasone (FLONASE) 50 MCG/ACT nasal spray Place 2 sprays into both nostrils daily.   loratadine (CLARITIN) 10 MG tablet Take 1 tablet by mouth daily.   meclizine (ANTIVERT) 12.5 MG tablet Take 12.5 mg by mouth 3 (three) times daily as needed for dizziness.   montelukast (SINGULAIR) 10 MG tablet Take 10 mg by mouth at bedtime.   olmesartan (BENICAR) 20 MG tablet Take 20 mg by mouth daily.   omeprazole (PRILOSEC) 20 MG capsule Take 20 mg by mouth daily.   oxybutynin (DITROPAN-XL) 10 MG 24 hr tablet Take 10 mg by mouth daily.   simvastatin (ZOCOR) 10 MG tablet Take 10 mg by mouth daily.   spironolactone (ALDACTONE) 25 MG tablet Take 25 mg by mouth daily.     Allergies:   Meperidine, Morphine,  Sulfamethoxazole, Thyroid , Yellow dyes (non-tartrazine), Clindamycin, Furosemide, Nitrofurantoin, Nitroglycerin , and Penicillin g   Social History   Socioeconomic History   Marital status: Married    Spouse name: Not on file   Number of children: Not on file   Years of education: Not on file   Highest education level: Not on file  Occupational History   Not on file  Tobacco Use   Smoking status: Never   Smokeless tobacco: Never  Vaping Use   Vaping status: Never Used  Substance and Sexual Activity   Alcohol use: Never   Drug use: Never   Sexual activity: Not on file  Other Topics Concern   Not on file  Social History Narrative   Not on file   Social Drivers of Health   Financial Resource Strain: Not on file  Food Insecurity: Low Risk  (  10/24/2023)   Received from [facility]   Hunger Vital Sign    Within the past 12 months, you worried that your food would run out before you got money to buy more: Never true    Within the past 12 months, the food you bought just didn't last and you didn't have money to get more. : Never true  Transportation Needs: No Transportation Needs (10/24/2023)   Received from [facility]    In the past 12 months, has lack of reliable transportation kept you from medical appointments, meetings, work or from getting things needed for daily living? : No  Physical Activity: Not on file  Stress: Not on file  Social Connections: Not on file     Family History: The patient's family history includes Heart attack in her father and mother; Heart disease in her father and mother; Hyperlipidemia in her brother, father, mother, and sister; Hypertension in her father, mother, and sister; Skin cancer in her father.  ROS:   Please see the history of present illness.    All other systems reviewed and are negative.  EKGs/Labs/Other Studies Reviewed:    The following studies were reviewed today: .SABRAEKG Interpretation Date/Time:  Tuesday  April 16 2024 08:07:36 EDT Ventricular Rate:  51 PR Interval:  130 QRS Duration:  84 QT Interval:  404 QTC Calculation: 372 R Axis:   67  Text Interpretation: Sinus bradycardia Otherwise normal ECG No previous ECGs available Confirmed by Edwyna Backers [phone]) on 04/16/2024 8:23:10 AM     Recent Labs: No results found for requested labs within last 365 days.  Recent Lipid Panel No results found for: CHOL, TRIG, HDL, CHOLHDL, VLDL, LDLCALC, LDLDIRECT  Physical Exam:    VS:  BP (!) 142/88   Pulse (!) 51   Ht 5' 2 (1.575 m)   Wt 247 lb 9.6 oz (112.3 kg)   SpO2 94%   BMI 45.29 kg/m     Wt Readings from Last 3 Encounters:  04/16/24 247 lb 9.6 oz (112.3 kg)  10/04/23 246 lb (111.6 kg)  09/25/23 246 lb (111.6 kg)     GEN: Patient is in no acute distress HEENT: Normal NECK: No JVD; No carotid bruits LYMPHATICS: No lymphadenopathy CARDIAC: Hear sounds regular, 2/6 systolic murmur at the apex. RESPIRATORY:  Clear to auscultation without rales, wheezing or rhonchi  ABDOMEN: Soft, non-tender, non-distended MUSCULOSKELETAL:  No edema; No deformity  SKIN: Warm and dry NEUROLOGIC:  Alert and oriented x 3 PSYCHIATRIC:  Normal affect   Signed, Backers JONELLE Edwyna, MD  04/16/2024 8:41 AM    [facility]

## 2024-04-17 ENCOUNTER — Ambulatory Visit: Payer: Self-pay | Admitting: Cardiology

## 2024-04-17 DIAGNOSIS — R7309 Other abnormal glucose: Secondary | ICD-10-CM | POA: Diagnosis not present

## 2024-04-17 DIAGNOSIS — L989 Disorder of the skin and subcutaneous tissue, unspecified: Secondary | ICD-10-CM | POA: Diagnosis not present

## 2024-04-17 LAB — BASIC METABOLIC PANEL WITH GFR
BUN/Creatinine Ratio: 19 (ref 12–28)
BUN: 15 mg/dL (ref 8–27)
CO2: 21 mmol/L (ref 20–29)
Calcium: 9.7 mg/dL (ref 8.7–10.3)
Chloride: 103 mmol/L (ref 96–106)
Creatinine, Ser: 0.81 mg/dL (ref 0.57–1.00)
Glucose: 96 mg/dL (ref 70–99)
Potassium: 4.4 mmol/L (ref 3.5–5.2)
Sodium: 140 mmol/L (ref 134–144)
eGFR: 79 mL/min/1.73 (ref >59)

## 2024-04-22 DIAGNOSIS — E119 Type 2 diabetes mellitus without complications: Secondary | ICD-10-CM | POA: Diagnosis not present

## 2024-04-24 ENCOUNTER — Encounter: Payer: Self-pay | Admitting: Vascular Surgery

## 2024-04-24 ENCOUNTER — Ambulatory Visit (HOSPITAL_COMMUNITY)
Admission: RE | Admit: 2024-04-24 | Discharge: 2024-04-24 | Disposition: A | Discharge status: Home / Self Care | Source: Ambulatory Visit | Attending: Vascular Surgery | Admitting: Vascular Surgery

## 2024-04-24 ENCOUNTER — Ambulatory Visit (HOSPITAL_COMMUNITY): Admitting: Vascular Surgery

## 2024-04-24 VITALS — BP 138/71 | HR 51 | Temp 97.9°F | Resp 20 | Ht 62.0 in | Wt 246.0 lb

## 2024-04-24 DIAGNOSIS — I872 Venous insufficiency (chronic) (peripheral): Secondary | ICD-10-CM | POA: Diagnosis not present

## 2024-04-24 DIAGNOSIS — I83023 Varicose veins of left lower extremity with ulcer of ankle: Secondary | ICD-10-CM | POA: Insufficient documentation

## 2024-04-24 DIAGNOSIS — L97329 Non-pressure chronic ulcer of left ankle with unspecified severity: Secondary | ICD-10-CM

## 2024-04-24 NOTE — Progress Notes (Addendum)
 Patient ID: Judy Lucas, female   DOB: June 17, 1956, 68 y.o.   MRN: [id number]  Reason for Consult: New Patient (Initial Visit)   Referred by Silver Lamar LABOR, MD  Subjective:     HPI:  Judy Lucas is a 68 y.o. female with history of C6 venous disease with previous attempted ablation of the left greater saphenous vein and satisfactory ablation of the small saphenous vein.  She did have an EHIT 3 after the great saphenous vein ablation and was also noted to have recannulization of the great saphenous vein on the left.  She also has a known anterior excessively saphenous vein that is incompetent.  She has a recent recurrence of her wound on her left medial ankle and states that it causes pain that is at times severe in nature and affects her sleep.  She has been attempting to wear compression stockings but due to the pain she has had limited ability to wear these.  She was recently diagnosed with a blood clot although I do not have records of this and currently taking Eliquis as she did after he had 3 diagnosis.  She has associated swelling, varicosities and skin changes of the left lower extremity as well.  Past Medical History:  Diagnosis Date   Abnormal CT of the chest 08/27/2019   Achilles tendinitis of left lower extremity 05/02/2022   Allergic rhinitis    Allergy    Anxiety    Chest discomfort 02/20/2019   Chest pain 01/22/2019   Choline deficiency    Can only tolerate versed for procedures   Diverticulosis    Dysphagia 01/22/2019   Heel spur, left 05/02/2022   Hyperlipidemia    Hypertension    Morbid obesity (HCC) 01/22/2019   Osteoarthritis 01/22/2019   Pharyngoesophageal dysphagia 01/22/2019   Last Assessment & Plan:  Formatting of this note might be different from the original. Concern over dysphagia.  She has intermittent periods where she will have difficulty with swallowing.  They are very brief in nature.  She had an esophageal dilation in the past that temporarily  helped.  She is on omeprazole once a day. EXAM shows normal oral cavity and oropharynx.  Neck is without adenopathy or   Pneumonia 05/2015   Renal insufficiency 01/22/2019   Sensorineural hearing loss (SNHL) of both ears 02/24/2021   Last Assessment & Plan:  Formatting of this note might be different from the original. Concern over her hearing. She has known sensorineural hearing loss bilaterally and wears hearing aids.  She recently was seen by her audiologist who noted something on testing that concerned her and was sent for evaluation.  In general, she is happy with her hearing with her hearing aids. EXAM shows normal exter   Tenosynovitis of left ankle 06/07/2022   Tenosynovitis of right ankle 06/07/2022   Tightness of heel cord, left 05/02/2022   Family History  Problem Relation Age of Onset   Hyperlipidemia Mother    Hypertension Mother    Heart disease Mother    Heart attack Mother    Skin cancer Father    Hyperlipidemia Father    Hypertension Father    Heart disease Father    Heart attack Father    Hypertension Sister    Hyperlipidemia Sister    Hyperlipidemia Brother    Past Surgical History:  Procedure Laterality Date   ABDOMINAL HYSTERECTOMY     APPENDECTOMY     CHOLECYSTECTOMY     COLONOSCOPY  10/14/2015   Colonic polyp  status post polypectomy. Predominantly sigmoid diverticulosis   COLONOSCOPY  05/31/2019   Dr Towana  Benign neoplasm of cecum. Repeat in 5 years   ENDOVENOUS ABLATION SAPHENOUS VEIN W/ LASER Left 10/17/2019   endovenous laser ablation anterior accessory branch of left greater saphenous vein by Medford Blade MD    ENDOVENOUS ABLATION SAPHENOUS VEIN W/ LASER Left 10/05/2022   endovenous laser ablation left small saphenous vein by Medford Blade MD   ESOPHAGOGASTRODUODENOSCOPY  05/31/2019   Dr Towana. There was no esophagitis. Multiple biopsies obtained from the esophagus to evaluate for eosinophilic esophagitis. Normal Z-Line. The EG junction did not  distend well and had increased spasm. A wide bore stricture or ring could have been missed due to lack of distension but there was no obvious stricture.   feet surgery     WRIST SURGERY      Short Social History:  Social History   Tobacco Use   Smoking status: Never   Smokeless tobacco: Never  Substance Use Topics   Alcohol use: Never    Allergies  Allergen Reactions   Meperidine Shortness Of Breath   Morphine Shortness Of Breath   Sulfamethoxazole Rash   Thyroid  Swelling   Yellow Dyes (Non-Tartrazine) Anaphylaxis   Clindamycin Rash    Significant erythroderma with the itching associated with clindamycin.  No swelling, wheezing, dyspnea, or airway compromise.   Furosemide Rash    SWELLING ALLERGY    Nitrofurantoin Rash   Nitroglycerin  Palpitations   Penicillin G Rash    Current Outpatient Medications  Medication Sig Dispense Refill   albuterol (VENTOLIN HFA) 108 (90 Base) MCG/ACT inhaler Inhale 2 puffs into the lungs every 4 (four) hours as needed for wheezing or shortness of breath.     aspirin EC 81 MG tablet Take 81 mg by mouth daily.     Cholecalciferol (VITAMIN D3) 50 MCG (2000 UT) capsule Take 2,000 Units by mouth daily.     diazepam  (VALIUM ) 5 MG tablet Take 1 tablet 30 minutes prior to leaving house on day of office surgery. 1 tablet 0   ELIQUIS 5 MG TABS tablet Take 5 mg by mouth 2 (two) times daily.     fluticasone (FLONASE) 50 MCG/ACT nasal spray Place 2 sprays into both nostrils daily.     loratadine (CLARITIN) 10 MG tablet Take 1 tablet by mouth daily.     meclizine (ANTIVERT) 12.5 MG tablet Take 12.5 mg by mouth 3 (three) times daily as needed for dizziness.     montelukast (SINGULAIR) 10 MG tablet Take 10 mg by mouth at bedtime.     olmesartan (BENICAR) 20 MG tablet Take 20 mg by mouth daily.     omeprazole (PRILOSEC) 20 MG capsule Take 20 mg by mouth daily.     oxybutynin (DITROPAN-XL) 10 MG 24 hr tablet Take 10 mg by mouth daily.     simvastatin (ZOCOR)  10 MG tablet Take 10 mg by mouth daily.     spironolactone (ALDACTONE) 25 MG tablet Take 25 mg by mouth daily.     No current facility-administered medications for this visit.    Review of Systems  Constitutional:  Constitutional negative. HENT: HENT negative.  Eyes: Eyes negative.  Respiratory: Respiratory negative.  Cardiovascular: Positive for leg swelling.  GI: Gastrointestinal negative.  Musculoskeletal: Positive for leg pain.  Skin: Positive for wound.  Neurological: Neurological negative. Hematologic: Hematologic/lymphatic negative.  Psychiatric: Psychiatric negative.        Objective:  Objective  Vitals:   04/24/24 9177  BP: 138/71  Pulse: (!) 51  Resp: 20  Temp: 97.9 F (36.6 C)  SpO2: 96%     Physical Exam HENT:     Nose: Nose normal.     Mouth/Throat:     Mouth: Mucous membranes are moist.  Eyes:     Pupils: Pupils are equal, round, and reactive to light.  Cardiovascular:     Rate and Rhythm: Normal rate.     Pulses: Normal pulses.  Abdominal:     General: Abdomen is flat.  Musculoskeletal:        General: Normal range of motion.     Cervical back: Normal range of motion.     Right lower leg: Edema present.     Left lower leg: Edema present.  Skin:    General: Skin is warm.     Capillary Refill: Capillary refill takes less than 2 seconds.  Neurological:     General: No focal deficit present.     Mental Status: She is alert.     Data: LEFT              Reflux NoRefluxReflux TimeDiameter cmsComments                                        Yes                                             +------------------+---------+------+-----------+------------+-------------  ----+  CFV                        yes   >1 second                                 +------------------+---------+------+-----------+------------+-------------  ----+  FV mid            no                                                         +------------------+---------+------+-----------+------------+-------------  ----+  Popliteal        no                                    chronic  thrombus   +------------------+---------+------+-----------+------------+-------------  ----+  GSV at SFJ                  yes    >500 ms      0.78                        +------------------+---------+------+-----------+------------+-------------  ----+  GSV prox thigh              yes    >500 ms      0.72    chronic  thrombus   +------------------+---------+------+-----------+------------+-------------  ----+  GSV mid thigh               yes    >500 ms  0.71                        +------------------+---------+------+-----------+------------+-------------  ----+  GSV dist thigh              yes    >500 ms      0.68    chronic  thrombus   +------------------+---------+------+-----------+------------+-------------  ----+  GSV at knee                 yes    >500 ms      0.4                         +------------------+---------+------+-----------+------------+-------------  ----+  GSV prox calf               yes    >500 ms      0.49                        +------------------+---------+------+-----------+------------+-------------  ----+  SSV at [facility]                                              prior                                                                        ablation/strippin                                                         g                   +------------------+---------+------+-----------+------------+-------------  ----+  SSV prox calf                                           prior                                                                        ablation/strippin                                                         g                    +------------------+---------+------+-----------+------------+-------------  ----+  SSV mid calf  prior                                                                        ablation/strippin                                                         g                   +------------------+---------+------+-----------+------------+-------------  ----+  ASV prox thigh              yes    >500 ms      0.65                        +------------------+---------+------+-----------+------------+-------------  ----+  ASV mid anterior            yes    >500 ms      0.49                        thigh                                                                       +------------------+---------+------+-----------+------------+-------------  ----+        Summary:  Left:  - Chronic thrombus noted in the popliteal vein.   - Chronic thrombus noted in the GSV as above.   - Deep vein reflux in the CFV.   - Superficial vein reflux in the SFJ, GSV, and anterior accessory  saphenous vein.         Assessment/Plan:     68 year old female with recurrent C6 venous disease of the left lower extremity secondary to a large refluxing great saphenous vein as well as anterior excessively saphenous vein.  I evaluated both of these at the bedside today.  The great saphenous vein does have some evidence of chronic thrombus but is very large, it is refluxing and is noticeably patent and compressible.  It is within the fascia from the thigh all the way to the saphenofemoral junction.  There is also an anterior excessively saphenous vein which is large, refluxing and compressible with multiple varicosities branching from it.  It is deep from the distal third of the thigh all the way up to the junction as well.  We have discussed the need for continued compressive therapy and we will refer to Dr. Harden for wound care of venous  ulcer.  She would benefit from left greater saphenous vein and anterior sensory saphenous vein concomitant ablation.  Patient is to complete Eliquis on October 20 and we can schedule after this time.  I would want her on at least baby aspirin therapy at the time of her procedure given her propensity for clotting as well as history of  EHIT after ablation of her left greater saphenous vein.  We discussed that she is at higher risk of DVT than the general population after procedure and will need continued wound care.  We discussed that treatment of reflux is to prevent recurrence and that she will require continued compressive therapy.  All questions were answered and she demonstrates good understanding.     Penne Lonni Colorado MD [facility]

## 2024-04-25 ENCOUNTER — Ambulatory Visit: Admitting: Orthopedic Surgery

## 2024-04-25 DIAGNOSIS — I87332 Chronic venous hypertension (idiopathic) with ulcer and inflammation of left lower extremity: Secondary | ICD-10-CM

## 2024-04-29 ENCOUNTER — Encounter: Payer: Self-pay | Admitting: Orthopedic Surgery

## 2024-04-29 NOTE — Progress Notes (Signed)
 Office Visit Note   Patient: Judy Lucas           Date of Birth: [birth year]           MRN: [id number] Visit Date: 04/25/2024              Requested by: Silver Lamar LABOR, MD [address] [place],  [place] [address] PCP: Silver Lamar LABOR, MD  Chief Complaint  Patient presents with   Left Leg - Wound Check      HPI: Discussed the use of AI scribe software for clinical note transcription with the patient, who gave verbal consent to proceed.  History of Present Illness Judy Lucas is a 68 year old female who presents with a venous stasis ulcer and leg swelling.  Her symptoms began in May with the development of two small holes in her leg, which have since expanded, causing significant discomfort. The swelling and ulceration make it difficult for her to tolerate air, bath, or shower water touching the area.  She has been using compression socks but finds them difficult to wear due to the severity of her symptoms. Despite using nonstick bandages, she is concerned about their effectiveness and is interested in finding a solution that allows her to wear compression stockings without exacerbating her symptoms.  She was diagnosed with a blood clot and prescribed Eliquis. She was also prescribed doxycycline after expressing concern about her leg condition worsening. Despite these measures, she continues to experience pain and swelling, and her leg ulcer has increased in size.     Assessment & Plan: Visit Diagnoses:  1. Chronic venous hypertension (idiopathic) with ulcer and inflammation of left lower extremity (HCC)     Plan: Assessment and Plan Assessment & Plan Left lower extremity venous stasis ulcer due to chronic venous insufficiency with edema Chronic venous insufficiency has led to a 6x3 cm venous stasis ulcer with fibrinous exudate and significant edema. Good arterial inflow confirmed by palpable dorsalis pedis pulse. Pain and discomfort hinder compression stocking  use. - Apply Dynaflex compression wrap to reduce swelling and promote healing. - Debride dead tissue to prevent infection and promote healing. - Use adherent wraps to remove dead tissue, avoiding nonstick bandages. - Follow up weekly to monitor progress and adjust treatment. - Discuss potential enrollment in venous leg ulcer study for additional treatment options.      Follow-Up Instructions: Return in about 1 week (around 05/02/2024).   Ortho Exam  Patient is alert, oriented, no adenopathy, well-dressed, normal affect, normal respiratory effort. Physical Exam EXTREMITIES: Calf circumference 55 cm. Palpable dorsalis pedis pulse with good arterial inflow. SKIN: Venous stasis ulcer over medial malleolus, 6x3 cm, with fibrinous exudative tissue.      Imaging: No results found. No images are attached to the encounter.  Labs: Lab Results  Component Value Date   ESRSEDRATE 13 07/30/2021   ESRSEDRATE 24 07/27/2020   CRP <1.0 07/27/2020     No results found for: ALBUMIN, PREALBUMIN, CBC  No results found for: MG No results found for: VD25OH  No results found for: PREALBUMIN    Latest Ref Rng & Units 01/22/2019    4:43 PM  CBC EXTENDED  WBC 3.4 - 10.8 x10E3/uL 6.3   RBC 3.77 - 5.28 x10E6/uL 3.37   Hemoglobin 11.1 - 15.9 g/dL 88.8   HCT 65.9 - 53.3 % 32.5   Platelets 150 - 450 x10E3/uL 213      There is no height or weight on file to calculate  BMI.  Orders:  No orders of the defined types were placed in this encounter.  No orders of the defined types were placed in this encounter.    Procedures: No procedures performed  Clinical Data: No additional findings.  ROS:  All other systems negative, except as noted in the HPI. Review of Systems  Objective: Vital Signs: There were no vitals taken for this visit.  Specialty Comments:  No specialty comments available.  PMFS History: Patient Active Problem List   Diagnosis Date Noted   History of  DVT (deep vein thrombosis) 04/16/2024   Aortic atherosclerosis (HCC) 04/16/2024   Anxiety    Choline deficiency    Diverticulosis    Tenosynovitis of left ankle 06/07/2022   Tenosynovitis of right ankle 06/07/2022   Achilles tendinitis of left lower extremity 05/02/2022   Heel spur, left 05/02/2022   Tightness of heel cord, left 05/02/2022   Sensorineural hearing loss (SNHL) of both ears 02/24/2021   Allergic rhinitis    Allergy    Hyperlipidemia    Hypertension    Abnormal CT of the chest 08/27/2019   Chest discomfort 02/20/2019   Renal insufficiency 01/22/2019   Pharyngoesophageal dysphagia 01/22/2019   Morbid obesity (HCC) 01/22/2019   Chest pain 01/22/2019   Osteoarthritis 01/22/2019   Dysphagia 01/22/2019   Pneumonia 05/2015   Past Medical History:  Diagnosis Date   Abnormal CT of the chest 08/27/2019   Achilles tendinitis of left lower extremity 05/02/2022   Allergic rhinitis    Allergy    Anxiety    Chest discomfort 02/20/2019   Chest pain 01/22/2019   Choline deficiency    Can only tolerate versed for procedures   Diverticulosis    Dysphagia 01/22/2019   Heel spur, left 05/02/2022   Hyperlipidemia    Hypertension    Morbid obesity (HCC) 01/22/2019   Osteoarthritis 01/22/2019   Pharyngoesophageal dysphagia 01/22/2019   Last Assessment & Plan:  Formatting of this note might be different from the original. Concern over dysphagia.  She has intermittent periods where she will have difficulty with swallowing.  They are very brief in nature.  She had an esophageal dilation in the past that temporarily helped.  She is on omeprazole once a day. EXAM shows normal oral cavity and oropharynx.  Neck is without adenopathy or   Pneumonia 05/2015   Renal insufficiency 01/22/2019   Sensorineural hearing loss (SNHL) of both ears 02/24/2021   Last Assessment & Plan:  Formatting of this note might be different from the original. Concern over her hearing. She has known  sensorineural hearing loss bilaterally and wears hearing aids.  She recently was seen by her audiologist who noted something on testing that concerned her and was sent for evaluation.  In general, she is happy with her hearing with her hearing aids. EXAM shows normal exter   Tenosynovitis of left ankle 06/07/2022   Tenosynovitis of right ankle 06/07/2022   Tightness of heel cord, left 05/02/2022    Family History  Problem Relation Age of Onset   Hyperlipidemia Mother    Hypertension Mother    Heart disease Mother    Heart attack Mother    Skin cancer Father    Hyperlipidemia Father    Hypertension Father    Heart disease Father    Heart attack Father    Hypertension Sister    Hyperlipidemia Sister    Hyperlipidemia Brother     Past Surgical History:  Procedure Laterality Date   ABDOMINAL HYSTERECTOMY  APPENDECTOMY     CHOLECYSTECTOMY     COLONOSCOPY  10/14/2015   Colonic polyp status post polypectomy. Predominantly sigmoid diverticulosis   COLONOSCOPY  05/31/2019   Dr Towana  Benign neoplasm of cecum. Repeat in 5 years   ENDOVENOUS ABLATION SAPHENOUS VEIN W/ LASER Left 10/17/2019   endovenous laser ablation anterior accessory branch of left greater saphenous vein by Medford Blade MD    ENDOVENOUS ABLATION SAPHENOUS VEIN W/ LASER Left 10/05/2022   endovenous laser ablation left small saphenous vein by Medford Blade MD   ESOPHAGOGASTRODUODENOSCOPY  05/31/2019   Dr Towana. There was no esophagitis. Multiple biopsies obtained from the esophagus to evaluate for eosinophilic esophagitis. Normal Z-Line. The EG junction did not distend well and had increased spasm. A wide bore stricture or ring could have been missed due to lack of distension but there was no obvious stricture.   feet surgery     WRIST SURGERY     Social History   Occupational History   Not on file  Tobacco Use   Smoking status: Never   Smokeless tobacco: Never  Vaping Use   Vaping status: Never Used   Substance and Sexual Activity   Alcohol use: Never   Drug use: Never   Sexual activity: Not on file

## 2024-04-30 ENCOUNTER — Other Ambulatory Visit: Payer: Self-pay | Admitting: *Deleted

## 2024-04-30 ENCOUNTER — Telehealth: Payer: Self-pay | Admitting: *Deleted

## 2024-04-30 DIAGNOSIS — L97329 Non-pressure chronic ulcer of left ankle with unspecified severity: Secondary | ICD-10-CM

## 2024-04-30 NOTE — Telephone Encounter (Signed)
 Returning Judy Lucas's telephone message regarding difficulty wearing compression dressing and inquiring about getting wound care in [place]. Judy Lucas is an established patient of Dr. Sheree with history of left ankle ulcer and was last seen on 04-24-2024. Judy Lucas was referred to Dr. Harden for wound care treatment/study.  Judy Lucas states she was seen at Dr. Crist office on 04-25-2024 and that the dressing applied fell halfway down my left leg shortly after it was put on.  States she was unable to apply the compression wrap and has been wearing her compression hose in the interim.  Judy Lucas states she lives in [place] and that weekly trips to [place] for wound care is not manageable for her.  She states she has a prior relationship with Wound Care center in [facility] and has made an appointment for tomorrow and has cancelled the appointment with Dr. Crist office later this week.

## 2024-05-01 DIAGNOSIS — I83028 Varicose veins of left lower extremity with ulcer other part of lower leg: Secondary | ICD-10-CM | POA: Diagnosis not present

## 2024-05-02 ENCOUNTER — Ambulatory Visit: Admitting: Orthopedic Surgery

## 2024-05-06 DIAGNOSIS — T148XXA Other injury of unspecified body region, initial encounter: Secondary | ICD-10-CM | POA: Diagnosis not present

## 2024-05-06 DIAGNOSIS — E782 Mixed hyperlipidemia: Secondary | ICD-10-CM | POA: Diagnosis not present

## 2024-05-06 DIAGNOSIS — Z23 Encounter for immunization: Secondary | ICD-10-CM | POA: Diagnosis not present

## 2024-05-06 DIAGNOSIS — I10 Essential (primary) hypertension: Secondary | ICD-10-CM | POA: Diagnosis not present

## 2024-05-07 ENCOUNTER — Other Ambulatory Visit: Payer: Self-pay | Admitting: *Deleted

## 2024-05-07 MED ORDER — LORAZEPAM 1 MG PO TABS: ORAL_TABLET | ORAL | 0 refills | Status: AC

## 2024-05-08 DIAGNOSIS — I83028 Varicose veins of left lower extremity with ulcer other part of lower leg: Secondary | ICD-10-CM | POA: Diagnosis not present

## 2024-05-13 DIAGNOSIS — M19071 Primary osteoarthritis, right ankle and foot: Secondary | ICD-10-CM | POA: Diagnosis not present

## 2024-05-13 DIAGNOSIS — M19072 Primary osteoarthritis, left ankle and foot: Secondary | ICD-10-CM | POA: Diagnosis not present

## 2024-05-13 DIAGNOSIS — I83023 Varicose veins of left lower extremity with ulcer of ankle: Secondary | ICD-10-CM | POA: Diagnosis not present

## 2024-05-16 ENCOUNTER — Ambulatory Visit: Attending: Vascular Surgery | Admitting: Vascular Surgery

## 2024-05-16 ENCOUNTER — Encounter: Payer: Self-pay | Admitting: Vascular Surgery

## 2024-05-16 VITALS — BP 144/81 | HR 49 | Temp 97.6°F | Resp 18 | Ht 63.0 in | Wt 245.0 lb

## 2024-05-16 DIAGNOSIS — I83023 Varicose veins of left lower extremity with ulcer of ankle: Secondary | ICD-10-CM

## 2024-05-16 DIAGNOSIS — L97322 Non-pressure chronic ulcer of left ankle with fat layer exposed: Secondary | ICD-10-CM

## 2024-05-16 HISTORY — PX: LASER ABLATION: SHX1947

## 2024-05-16 NOTE — Progress Notes (Signed)
 Patient name: Judy Lucas MRN: [id number] DOB: 1956/07/11 Sex: female  REASON FOR VISIT: Treatment of left lower extremity chronic venous insufficiency with left medial malleolar wound  HPI: Judy Lucas is a 68 y.o. female with history of C6 venous disease with previous attempted ablation of the left greater saphenous vein and ultimate ablation of the small saphenous vein.  At that time she had notable EHIT3.  She has recurrence of her wound on the left medial malleolus and has been followed in the wound care center and continues with compressive wraps.  She has refluxing greater saphenous and anterior sensory saphenous veins we have discussed proceeding with ablation of both including the risk of DVT and risk of recurrence and she demonstrates good understanding and agrees to proceed.  Current Outpatient Medications  Medication Sig Dispense Refill   albuterol (VENTOLIN HFA) 108 (90 Base) MCG/ACT inhaler Inhale 2 puffs into the lungs every 4 (four) hours as needed for wheezing or shortness of breath.     aspirin EC 81 MG tablet Take 81 mg by mouth daily.     Cholecalciferol (VITAMIN D3) 50 MCG (2000 UT) capsule Take 2,000 Units by mouth daily.     diazepam  (VALIUM ) 5 MG tablet Take 1 tablet 30 minutes prior to leaving house on day of office surgery. 1 tablet 0   ELIQUIS 5 MG TABS tablet Take 5 mg by mouth 2 (two) times daily.     fluticasone (FLONASE) 50 MCG/ACT nasal spray Place 2 sprays into both nostrils daily.     loratadine (CLARITIN) 10 MG tablet Take 1 tablet by mouth daily.     LORazepam  (ATIVAN ) 1 MG tablet Take 2 tablets 30 to 60 minutes prior to leaving house on day of office surgery.  Bring third tablet with you to office on day of office surgery. 3 tablet 0   meclizine (ANTIVERT) 12.5 MG tablet Take 12.5 mg by mouth 3 (three) times daily as needed for dizziness.     montelukast (SINGULAIR) 10 MG tablet Take 10 mg by mouth at bedtime.     olmesartan (BENICAR) 20 MG tablet  Take 20 mg by mouth daily.     omeprazole (PRILOSEC) 20 MG capsule Take 20 mg by mouth daily.     oxybutynin (DITROPAN-XL) 10 MG 24 hr tablet Take 10 mg by mouth daily.     simvastatin (ZOCOR) 10 MG tablet Take 10 mg by mouth daily.     spironolactone (ALDACTONE) 25 MG tablet Take 25 mg by mouth daily.     No current facility-administered medications for this visit.    PHYSICAL EXAM: Vitals:   05/16/24 0818  BP: (!) 144/81  Pulse: (!) 49  Resp: 18  Temp: 97.6 F (36.4 C)  TempSrc: Temporal  SpO2: 97%  Weight: 245 lb (111.1 kg)  Height: 5' 3 (1.6 m)  Awake alert and oriented Nonlabored respirations Left medial malleolar wound with dressing in place  PROCEDURE: Left greater saphenous vein laser ablation totaling 19 cm and anterior accessory saphenous vein laser ablation totaling 10 cm  TECHNIQUE: Judy Lucas was taken the procedure room where she was initially placed supine on the procedure table and sterilely prepped and draped in the left lower extremity and a timeout was called.  We began using ultrasound to identify both the anterior sensory saphenous vein and great saphenous vein and the areas were anesthetized 1% lidocaine and they were both cannulated with micropuncture needle followed by wire and a sheath.  In  the anterior sensory saphenous vein we placed a 25 cm catheter up 3 cm proximal to the saphenofemoral junction.  It was notable that the anterior sensory and great saphenous veins both joined the saphenofemoral junction at the same level and for this reason we elected for ablation of both.  We then placed a 45 cm laser catheter up to 3 cm proximal saphenofemoral junction.  Tumescent anesthesia was instilled first along the great saphenous vein and this was then ablated for a total of 19 cm.  After this the common femoral vein was noted to be patent and compressible.  We then instilled tumescent anesthesia along the anterior accessory saphenous vein and this was then ablated  for a total of 10 cm.  At completion both veins appeared sclerotic although were difficult to visualize with the amount of tumescent but the common femoral vein was noted to be patent and compressible.  A sterile compressive wrap was placed.  She tolerated the procedure without any complication.  Plan will be for follow-up in 2 weeks with postablation duplex.  Judy Lucas [facility] [phone]

## 2024-05-16 NOTE — Progress Notes (Signed)
     Laser Ablation Procedure    Date: 05/16/2024 Judy Lucas DOB:1955-09-02 Consent signed: Yes     Surgeon: Dr. Penne Colorado Procedure: Laser Ablation: left Greater Saphenous Vein Laser and left anterior accessory saphenous vein laser  BP (!) 144/81 (BP Location: Left Arm, Patient Position: Sitting, Cuff Size: Large)   Pulse (!) 49   Temp 97.6 F (36.4 C) (Temporal)   Resp 18   Ht 5' 3 (1.6 m)   Wt 245 lb (111.1 kg)   SpO2 97%   BMI 43.40 kg/m  Tumescent Anesthesia: 200 cc 0.9% NaCl with 50 cc Lidocaine HCL 1%  and 15 cc 8.4% NaHCO3 Local Anesthesia: 6 cc Lidocaine HCL and NaHCO3 (ratio 2:1) 7 watts continuous mode     Laser A Laser Greater Saphenous Vein  Total energy: 952.9 joules    Total time: 136 seconds Treatment Length 19 cm  Laser Fiber Ref. #   [id number]      Lot # [id number]  Laser B left anterior accessory saphenous vein Total energy: 504.2 joules    Total time: 72 seconds Treatment Length 10 cm Laser Fiber Ref. #  [id number]       Lot # [id number]   Note:  All staff members wore facial masks and facial shields/goggles.  Pt had 2 tabs (1 mg) ativan  at 07:00 am prior to the surgical procedure Patient tolerated procedure well Description of Procedure: After marking the course of the secondary varicosities, the patient was placed on the operating table in the supine position, and the left leg was prepped and draped in sterile fashion.   Local anesthetic was administered and under ultrasound guidance the saphenous vein was accessed with a micro needle and guide wire; then the mirco puncture sheath was placed.  A guide wire was inserted saphenofemoral junction , followed by a 5 french sheath.  The position of the sheath and then the laser fiber below the junction was confirmed using the ultrasound.  Tumescent anesthesia was administered along the course of the saphenous vein using ultrasound guidance. The patient was placed in Trendelenburg position and protective laser  glasses were placed on patient and staff, and the laser was fired at 7 watts continuous mode for a total of 952.9 joules for laser A  of the Laser Greater Saphenous Vein and Laser B of the  left anterior accessory saphenous vein a total of 504.2 Joules.   Steri strips were applied to the stab wounds and ABD pads and thigh high compression stockings were applied.  Ace wrap bandages were applied over the phlebectomy sites and at the top of the saphenofemoral junction. Blood loss was less than 15 cc.  Discharge instructions reviewed with patient and hardcopy of discharge instructions given to patient to take home. The patient  was wheeled out of the operating room having tolerated the procedure well.

## 2024-05-30 ENCOUNTER — Ambulatory Visit (INDEPENDENT_AMBULATORY_CARE_PROVIDER_SITE_OTHER): Admitting: Vascular Surgery

## 2024-05-30 ENCOUNTER — Encounter: Payer: Self-pay | Admitting: Vascular Surgery

## 2024-05-30 ENCOUNTER — Ambulatory Visit (HOSPITAL_COMMUNITY)
Admission: RE | Admit: 2024-05-30 | Discharge: 2024-05-30 | Disposition: A | Discharge status: Home / Self Care | Source: Ambulatory Visit | Attending: Vascular Surgery | Admitting: Vascular Surgery

## 2024-05-30 VITALS — BP 132/76 | HR 54 | Temp 97.9°F | Ht 63.0 in | Wt 240.0 lb

## 2024-05-30 DIAGNOSIS — I83023 Varicose veins of left lower extremity with ulcer of ankle: Secondary | ICD-10-CM | POA: Diagnosis present

## 2024-05-30 DIAGNOSIS — L97329 Non-pressure chronic ulcer of left ankle with unspecified severity: Secondary | ICD-10-CM

## 2024-05-30 NOTE — Progress Notes (Signed)
 Patient ID: Judy Lucas, female   DOB: Jun 12, 1956, 68 y.o.   MRN: [id number]  Reason for Consult: Routine Post Op   Referred by Silver Lamar LABOR, MD  Subjective:     HPI:  Judy Lucas is a 68 y.o. female follows up after left greater saphenous vein and anterior excessively saphenous ablation for treatment of left medial malleolar venous ulceration.  She has recovered very well with no complaints today.  She follows with Dr. Joesph for her wound which she states is now healing.  She does have painful varicosities of the right lower extremity she has been wearing bilateral knee-high compression stockings she is unable to wear thigh-high due to the shape of her legs but she has been religious with wearing the knee.  She does not have any wounds on the right lower extremity.  Past Medical History:  Diagnosis Date   Abnormal CT of the chest 08/27/2019   Achilles tendinitis of left lower extremity 05/02/2022   Allergic rhinitis    Allergy    Anxiety    Chest discomfort 02/20/2019   Chest pain 01/22/2019   Choline deficiency    Can only tolerate versed for procedures   Diverticulosis    Dysphagia 01/22/2019   Heel spur, left 05/02/2022   Hyperlipidemia    Hypertension    Morbid obesity (HCC) 01/22/2019   Osteoarthritis 01/22/2019   Pharyngoesophageal dysphagia 01/22/2019   Last Assessment & Plan:  Formatting of this note might be different from the original. Concern over dysphagia.  She has intermittent periods where she will have difficulty with swallowing.  They are very brief in nature.  She had an esophageal dilation in the past that temporarily helped.  She is on omeprazole once a day. EXAM shows normal oral cavity and oropharynx.  Neck is without adenopathy or   Pneumonia 05/2015   Renal insufficiency 01/22/2019   Sensorineural hearing loss (SNHL) of both ears 02/24/2021   Last Assessment & Plan:  Formatting of this note might be different from the original. Concern over her  hearing. She has known sensorineural hearing loss bilaterally and wears hearing aids.  She recently was seen by her audiologist who noted something on testing that concerned her and was sent for evaluation.  In general, she is happy with her hearing with her hearing aids. EXAM shows normal exter   Tenosynovitis of left ankle 06/07/2022   Tenosynovitis of right ankle 06/07/2022   Tightness of heel cord, left 05/02/2022   Family History  Problem Relation Age of Onset   Hyperlipidemia Mother    Hypertension Mother    Heart disease Mother    Heart attack Mother    Skin cancer Father    Hyperlipidemia Father    Hypertension Father    Heart disease Father    Heart attack Father    Hypertension Sister    Hyperlipidemia Sister    Hyperlipidemia Brother    Past Surgical History:  Procedure Laterality Date   ABDOMINAL HYSTERECTOMY     APPENDECTOMY     CHOLECYSTECTOMY     COLONOSCOPY  10/14/2015   Colonic polyp status post polypectomy. Predominantly sigmoid diverticulosis   COLONOSCOPY  05/31/2019   Dr Towana  Benign neoplasm of cecum. Repeat in 5 years   ENDOVENOUS ABLATION SAPHENOUS VEIN W/ LASER Left 10/17/2019   endovenous laser ablation anterior accessory branch of left greater saphenous vein by Medford Blade MD    ENDOVENOUS ABLATION SAPHENOUS VEIN W/ LASER Left 10/05/2022   endovenous laser  ablation left small saphenous vein by Medford Blade MD   ESOPHAGOGASTRODUODENOSCOPY  05/31/2019   Dr Towana. There was no esophagitis. Multiple biopsies obtained from the esophagus to evaluate for eosinophilic esophagitis. Normal Z-Line. The EG junction did not distend well and had increased spasm. A wide bore stricture or ring could have been missed due to lack of distension but there was no obvious stricture.   feet surgery     LASER ABLATION Left 05/16/2024   Laser ablation of the left greater saphenous vein and left anterior accessory saphenous vein   WRIST SURGERY      Short Social  History:  Social History   Tobacco Use   Smoking status: Never   Smokeless tobacco: Never  Substance Use Topics   Alcohol use: Never    Allergies  Allergen Reactions   Meperidine Shortness Of Breath   Morphine Shortness Of Breath   Sulfamethoxazole Rash   Thyroid  Swelling   Yellow Dyes (Non-Tartrazine) Anaphylaxis   Clindamycin Rash    Significant erythroderma with the itching associated with clindamycin.  No swelling, wheezing, dyspnea, or airway compromise.   Furosemide Rash    SWELLING ALLERGY    Nitrofurantoin Rash   Nitroglycerin  Palpitations   Penicillin G Rash    Current Outpatient Medications  Medication Sig Dispense Refill   albuterol (VENTOLIN HFA) 108 (90 Base) MCG/ACT inhaler Inhale 2 puffs into the lungs every 4 (four) hours as needed for wheezing or shortness of breath.     aspirin EC 81 MG tablet Take 81 mg by mouth daily.     Cholecalciferol (VITAMIN D3) 50 MCG (2000 UT) capsule Take 2,000 Units by mouth daily.     diazepam  (VALIUM ) 5 MG tablet Take 1 tablet 30 minutes prior to leaving house on day of office surgery. 1 tablet 0   fluticasone (FLONASE) 50 MCG/ACT nasal spray Place 2 sprays into both nostrils daily.     loratadine (CLARITIN) 10 MG tablet Take 1 tablet by mouth daily.     LORazepam  (ATIVAN ) 1 MG tablet Take 2 tablets 30 to 60 minutes prior to leaving house on day of office surgery.  Bring third tablet with you to office on day of office surgery. 3 tablet 0   meclizine (ANTIVERT) 12.5 MG tablet Take 12.5 mg by mouth 3 (three) times daily as needed for dizziness.     montelukast (SINGULAIR) 10 MG tablet Take 10 mg by mouth at bedtime.     olmesartan (BENICAR) 20 MG tablet Take 20 mg by mouth daily.     omeprazole (PRILOSEC) 20 MG capsule Take 20 mg by mouth daily.     oxybutynin (DITROPAN-XL) 10 MG 24 hr tablet Take 10 mg by mouth daily.     simvastatin (ZOCOR) 10 MG tablet Take 10 mg by mouth daily.     spironolactone (ALDACTONE) 25 MG tablet  Take 25 mg by mouth daily.     ELIQUIS 5 MG TABS tablet Take 5 mg by mouth 2 (two) times daily.     No current facility-administered medications for this visit.    Review of Systems  Constitutional:  Constitutional negative. HENT: HENT negative.  Eyes: Eyes negative.  Respiratory: Respiratory negative.  Cardiovascular: Cardiovascular negative.  GI: Gastrointestinal negative.  Musculoskeletal: Musculoskeletal negative.  Skin: Positive for wound.  Neurological: Neurological negative. Hematologic: Hematologic/lymphatic negative.  Psychiatric: Psychiatric negative.        Objective:  Objective  Vitals:   05/30/24 0955  BP: 132/76  Pulse: (!) 54  Temp:  97.9 F (36.6 C)  SpO2: 97%     Physical Exam HENT:     Head: Normocephalic.     Mouth/Throat:     Mouth: Mucous membranes are moist.  Cardiovascular:     Rate and Rhythm: Normal rate.     Pulses: Normal pulses.  Pulmonary:     Effort: Pulmonary effort is normal.  Abdominal:     General: Abdomen is flat.     Palpations: Abdomen is soft.  Musculoskeletal:        General: Normal range of motion.     Right lower leg: No edema.     Left lower leg: No edema.  Skin:    General: Skin is warm.     Capillary Refill: Capillary refill takes less than 2 seconds.  Neurological:     General: No focal deficit present.     Mental Status: She is alert.        Data: LEFT     Reflux NoRefluxReflux TimeDiameter cmsComments                     Yes                                   +---------+---------+------+-----------+------------+--------+  CFV                                           patent    +---------+---------+------+-----------+------------+--------+  FV mid                                         patent    +---------+---------+------+-----------+------------+--------+  Popliteal                                     patent     +---------+---------+------+-----------+------------+--------+        Summary:  Left:  - Successful ablation of the great saphenous vein from the mid thigh up to  approximately 3.0 cm from the saphenofemoral junction.  - Successul ablation of the anterior saphenous vein from the mid thigh up  to approximately 0.91 cm from the saphenofemoral junction.  - No evidence of deep vein thrombosis.      Assessment/Plan:     68 year old female status post anterior and great saphenous vein ablation for left medial malleolar wound.  She has a past history of small saphenous vein ablation.  She is recovering well and states that the wound is healing although it was wrapped today the skin around the wrap is much improved.  She does have symptomatic varicosities of the right lower extremity thankfully with no skin changes consistent with C3 venous disease and she has been compliant with bilateral compressive stockings.  I will have her follow-up in 3 months time to discuss treatment of right lower extremity venous disease and we will evaluate with right lower extremity venous reflux test at that time.  She will continue aspirin.     Penne Lonni [place] MD [facility]

## 2024-06-04 ENCOUNTER — Other Ambulatory Visit: Admitting: Vascular Surgery

## 2024-06-17 ENCOUNTER — Encounter: Payer: Self-pay | Admitting: Radiology

## 2024-06-19 ENCOUNTER — Encounter (HOSPITAL_COMMUNITY)

## 2024-06-19 ENCOUNTER — Encounter: Admitting: Vascular Surgery

## 2024-08-19 ENCOUNTER — Ambulatory Visit
Admission: RE | Admit: 2024-08-19 | Discharge: 2024-08-19 | Disposition: A | Discharge status: Home / Self Care | Source: Ambulatory Visit | Attending: Internal Medicine | Admitting: Internal Medicine

## 2024-08-19 DIAGNOSIS — R0609 Other forms of dyspnea: Secondary | ICD-10-CM

## 2024-08-19 DIAGNOSIS — R9389 Abnormal findings on diagnostic imaging of other specified body structures: Secondary | ICD-10-CM

## 2024-08-19 DIAGNOSIS — R911 Solitary pulmonary nodule: Secondary | ICD-10-CM

## 2024-08-20 ENCOUNTER — Ambulatory Visit

## 2024-08-20 ENCOUNTER — Encounter: Payer: Self-pay | Admitting: Internal Medicine

## 2024-08-20 ENCOUNTER — Ambulatory Visit: Admitting: Internal Medicine

## 2024-08-20 VITALS — BP 118/62 | HR 58 | Ht 63.0 in | Wt 243.0 lb

## 2024-08-20 DIAGNOSIS — R7689 Other specified abnormal immunological findings in serum: Secondary | ICD-10-CM | POA: Diagnosis not present

## 2024-08-20 DIAGNOSIS — J849 Interstitial pulmonary disease, unspecified: Secondary | ICD-10-CM

## 2024-08-20 DIAGNOSIS — R911 Solitary pulmonary nodule: Secondary | ICD-10-CM

## 2024-08-20 DIAGNOSIS — R918 Other nonspecific abnormal finding of lung field: Secondary | ICD-10-CM

## 2024-08-20 DIAGNOSIS — R0609 Other forms of dyspnea: Secondary | ICD-10-CM

## 2024-08-20 DIAGNOSIS — R9389 Abnormal findings on diagnostic imaging of other specified body structures: Secondary | ICD-10-CM

## 2024-08-20 LAB — PULMONARY FUNCTION TEST
DL/VA % pred: 110 %
DL/VA: 4.62 ml/min/mmHg/L
DLCO unc % pred: 89 %
DLCO unc: 16.91 ml/min/mmHg
FEF 25-75 Pre: 2.59 L/s
FEF2575-%Pred-Pre: 137 %
FEV1-%Pred-Pre: 89 %
FEV1-Pre: 1.98 L
FEV1FVC-%Pred-Pre: 111 %
FEV6-%Pred-Pre: 83 %
FEV6-Pre: 2.33 L
FEV6FVC-%Pred-Pre: 104 %
FVC-%Pred-Pre: 79 %
FVC-Pre: 2.33 L
Pre FEV1/FVC ratio: 85 %
Pre FEV6/FVC Ratio: 100 %

## 2024-08-20 NOTE — Patient Instructions (Addendum)
 Solitary pulmonary nodule  -15 mm nodule right costophrenic angle stable between December 2020 in December 2021. NO change in Dec 2022 CT. Not easily visible Feb 2024 CT  Plan - await reuslts on Jan 2026 CT chest  Interstitial lung abnormalities December 2021 - > Jan 2026 Personal history of COVID-19 History of community acquired pneumonia remote RNP antibiody positive P-ANCA positive  -There is  ver very mild scar tissue in the bottom of the lung.  This probably reflects admission for pneumonia 15 years ago and COVID-19 in February 2020 and also repeat COVID-19 in August/September 2021. We call this interstitial lung abnormality  - Curently asymptomatic with normal PFT - CT chest results 08/19/24 pending  Plan - will releast Jan 2026 results to you   -if getting worse will see you sooner or else do PFt in 1 year and then decide on next CT - surveillance approach  - avoiding biopsy or empiric treatment approach -spiro/dlco - Jan 2027 (1 year) -  Follow-up = cancl pending JAn 2026 appt - return in 1 year  for  15 min visit but after PFT   - simple walk test at followup

## 2024-08-20 NOTE — Progress Notes (Signed)
Spiro/DLCO performed today. 

## 2024-08-20 NOTE — Patient Instructions (Signed)
Spiro/DLCO performed today. 

## 2024-08-20 NOTE — Progress Notes (Signed)
 "      OV 09/29/2020  Subjective:  Patient ID: Judy Lucas, female , DOB: 1956-04-02 , age 69 y.o. , MRN: [id number] , ADDRESS: [phone] [place] [address] PCP Gable Cambric, MD Patient Care Team: Gable Cambric, MD as PCP - General (Internal Medicine)  This Provider for this visit: Treatment Team:  Attending Provider: Geronimo Amel, MD    09/29/2020 -   Chief Complaint  Patient presents with   Follow-up    Doing well  .   ICD-10-CM   1. Solitary pulmonary nodule  R91.1   2. Abnormal CT of the chest  R93.89   3. Personal history of COVID-19  Z86.16   4. History of community acquired pneumonia  Z87.01      HPI Judy Lucas 69 y.o. -follow-up for the above issues.  She was a patient of Dr. Gretta.  Dr. Gretta is now in the ICU rotation service.  Patient is being transferred to Dr. Geronimo.  She is solitary pulmonary nodule 15 mm in the right costophrenic angle.  She had follow-up 1 year scan in December 2021 and it is stable.  She has early interstitial lung abnormalities in the lung bases.  According to the radiologist it is worse.  According to the patient she is asymptomatic without any shortness of breath or cough wheezing orthopnea proximal nocturnal dyspnea.  She denies any acid reflux.  She denies any tobacco smoking.  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  There is no mold or mildew in the house.  No feather pillow.  She has a maple tree allergy that seasonal but otherwise she is fine.  She gives a history of community-acquired pneumonia and hospitalization 15 years ago.  Also COVID-19 in February 2020 and repeat in August/September 2021 for which she was not hospitalized.  She believes the scar tissue might be related to that.    CT Chest data  Narrative & Impression  CLINICAL DATA:  Pulmonary nodule, follow-up examination   EXAM: CT CHEST WITHOUT CONTRAST   TECHNIQUE: Multidetector CT imaging of the chest was performed following  the standard protocol without IV contrast.   COMPARISON:  07/19/2019, 06/18/2015   FINDINGS: Cardiovascular: No significant coronary artery calcification. Global cardiac size within normal limits. No pericardial effusion. Central pulmonary arteries are of normal caliber. Mild atherosclerotic calcifications seen within the thoracic aorta. No aortic aneurysm.   Mediastinum/Nodes: Visualized thyroid  unremarkable. No pathologic thoracic adenopathy. Esophagus unremarkable.   Lungs/Pleura: Ovoid nodule seen within the right costophrenic angle at axial image # 113 is stable measuring 7 mm x 15 mm (average 11 mm). Focal nodule within the focus of parenchymal scarring within the right lower lobe, previously seen on axial image # 78, has resolved. 3 mm nodule within the left upper lobe, axial image # 27, is stable. Subpleural ground-glass pulmonary infiltrate with associated interlobular septal thickening at the lung bases bilaterally has progressed slightly since prior examination, likely reflecting progressive fibrotic change. No pneumothorax or pleural effusion. Central airways are widely patent.   Upper Abdomen: Cholecystectomy has been performed. No acute abnormality.   Musculoskeletal: No acute bone abnormality.   IMPRESSION: Stable dominant pulmonary nodule within the right lower lobe. Continued imaging follow-up is recommended in 1 year to document continued stability. This recommendation follows the consensus statement: Guidelines for Management of Incidental Pulmonary Nodules Detected on CT Images: From the Fleischner Society 2017; Radiology 2017; 284:228-243.   Mild interval progression of probable bibasilar pulmonary  fibrotic change.   Aortic Atherosclerosis (ICD10-I70.0).     Electronically Signed   By: Dorethia Molt MD   On: 07/17/2020 04:26          OV 07/30/2021  Subjective:  Patient ID: Judy Lucas, female , DOB: [birth year] , age 69 y.o. , MRN:  [id number] , ADDRESS: [phone] [place] [address] PCP Gable Cambric, MD Patient Care Team: Gable Cambric, MD as PCP - General (Internal Medicine)  This Provider for this visit: Treatment Team:  Attending Provider: Geronimo Amel, MD    07/30/2021 -   Chief Complaint  Patient presents with   Follow-up    Pt is here to discuss results of recent CT.  Pt states she has been doing okay since last visit and denies any complaints.   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  HPI Judy Lucas 69 y.o. -last seen earlier this year.  Since then stable no interim COVID.  No interim issues.  Early morning she has occasional dry cough from what she thinks is allergies.  No shortness of breath with exertion.  Sometimes she feels muscle pull on the back of her chest but otherwise no issues.  This is baseline and chronic.  She had high-resolution CT scan of the chest that shows right lower lobe nodule is stable and radiologist does deem this is benign.  However they are concerned about ILD changes.  I did visualize this and agree with them.  They are saying it is worst in 2016 but stable in the last 1 year.  I did share this with the patient.  Did explain to her that some of the ILD's could get worse.  She is interested in getting a work-up done and following physician recommendation on this.  She is somewhat reassured that the ILD is deemed stable in the last 1 year on CT scan.  Of note her husband is also here with her today.    CT Chest data  CT Chest High Resolution  Result Date: 07/28/2021 CLINICAL DATA:  Pulmonary nodule, possible pulmonary fibrosis, evaluate for progression EXAM: CT CHEST WITHOUT CONTRAST TECHNIQUE: Multidetector CT imaging of the chest was performed  following the standard protocol without intravenous contrast. High resolution imaging of the lungs, as well as inspiratory and expiratory imaging, was performed. COMPARISON:  07/16/2020, 07/19/2019, 04/25/2019, 06/18/2015 FINDINGS: Cardiovascular: Scattered aortic atherosclerosis. Cardiomegaly. No pericardial effusion. Mediastinum/Nodes: No enlarged mediastinal, hilar, or axillary lymph nodes. Thyroid  gland, trachea, and esophagus demonstrate no significant findings. Lungs/Pleura: Mild pulmonary fibrosis in a pattern with apical to basal gradient, featuring irregular peripheral interstitial opacity and septal thickening without evidence of bronchiolectasis or honeycombing. Fibrosis is not significantly changed compared to immediate prior examination. Mild lobular air trapping on expiratory phase imaging. Stable, definitively benign nodule of the right lung base measuring 1.4 x 0.9 cm (series 5, image 230). No pleural effusion or pneumothorax. Upper Abdomen: No acute abnormality.  Status post cholecystectomy. Musculoskeletal: No chest wall mass or suspicious bone lesions identified. IMPRESSION: 1. Mild pulmonary fibrosis in a pattern with apical to basal gradient, featuring irregular peripheral interstitial opacity and septal thickening without evidence of bronchiolectasis or honeycombing. Fibrotic findings are not significantly changed compared to immediate prior examination although clearly worsened over time dating back to 06/18/2015. Strictly characterized, findings are (early) indeterminate for UIP  per consensus guidelines, although progression over time is concerning for UIP: Diagnosis of Idiopathic Pulmonary Fibrosis: An Official ATS/ERS/JRS/ALAT Clinical Practice Guideline. Am JINNY Honey Crit Care Med Vol 198, Iss 5, [phone], Apr 15 2017. 2. Stable, definitively benign nodule of the right lung base. No further follow-up is specifically required for this benign nodule. 3. Cardiomegaly. Aortic Atherosclerosis  (ICD10-I70.0). Electronically Signed   By: Marolyn JONETTA Jaksch M.D.   On: 07/28/2021 14:49      OV 09/30/2021  Subjective:  Patient ID: Judy Lucas, female , DOB: 03-20-1956 , age 79 y.o. , MRN: [id number] , ADDRESS: [phone] [place] [address] PCP Gable Cambric, MD Patient Care Team: Gable Cambric, MD as PCP - General (Internal Medicine)  This Provider for this visit: Treatment Team:  Attending Provider: Geronimo Amel, MD    09/30/2021 -   Chief Complaint  Patient presents with   Follow-up    PFT performed today.  Pt states she has been doing okay since last visit and denies any complaints.     HPI Judy Lucas 68 y.o. -returns for follow-up.  She is doing well.  According to her husband she is wheezing less and is less short of breath now.  She had pulmonary function testing that shows an improvement compared to 1 year ago and it is actually normal now.  In addition a walking desaturation test is normal.  She had autoimmune profile and this shows RNP antibody positive and p-ANCA positive.  She tells me that she has a chronic history of allergies and also some kind of enzyme deficiency and so she think she has autoimmune issues but it sounds like it is more like maple tree allergies she takes Kenalog  shots earlier in the year.  She is also allergic to various food items.  I did share with her the autoimmune antibody results.  She prefers a more expectant approach and seeing a rheumatologist.  Also given her improvement in pulmonary function test and normal walking desaturation test and absence of crackles on lung exam today we took a shared decision making approach to continue surveillance as opposed to going getting a lung biopsy or starting empiric treatment.  Specifically her ILD question at was done in detail and the details are below here  [facility] Integrated Comprehensive ILD Questionnaire  Symptoms:  Past Medical History :  GERD + Allergies Hx of ppna Hx of  cpovid   ROS:  Arthralgia Occasionally has sore throat and takes omeprazole Has dry eyes Has allergies and rash  FAMILY HISTORY of LUNG DISEASE:  Negative*  PERSONAL EXPOSURE HISTORY:  No smoking no vaping.  No marijuana use no cocaine use  HOME  EXPOSURE and HOBBY DETAILS :  [place]  Age of the home is 47-1/2 years.  She has lived there since then.  Detail organic antigen exposure history in the house is negative  OCCUPATIONAL HISTORY (122 questions) : -Did some tobacco growing as a young person.  Has done some laboratory work.  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  But overall worked as a engineer, civil (consulting)  PULMONARY TOXICITY HISTORY (27 items):  Took Macrodantin several years ago and is highly allergic to it Is taking prednisone intermittently for allergies Takes Kenalog  shot once a year  INVESTIGATIONS: x       Latest Reference Range & Units 07/27/20 10:19 07/30/21 11:06  Anti Nuclear Antibody (ANA) NEGATIVE   NEGATIVE  Angiotensin-Converting Enzyme 9 - 67  U/L  23  Cyclic Citrullin Peptide Ab UNITS <16 <16  ds DNA Ab IU/mL  <1  ENA RNP Ab 0.0 - 0.9 AI  3.4 (H)  Myeloperoxidase Abs AI  <1.0  Serine Protease 3 AI  <1.0  RA Latex Turbid. <14 IU/mL <14 <14  Atypical P-ANCA titer <1:20 titer  1:160 (H)  SSA (Ro) (ENA) Antibody, IgG <1.0 NEG AI <1.0 NEG <1.0 NEG  SSB (La) (ENA) Antibody, IgG <1.0 NEG AI <1.0 NEG <1.0 NEG  Scleroderma (Scl-70) (ENA) Antibody, IgG <1.0 NEG AI  <1.0 NEG  (H): Data is abnormally high   Latest Reference Range & Units 07/30/21 11:06  A.Fumigatus #1 Abs Negative  Negative  Micropolyspora faeni, IgG Negative  Negative  Thermoactinomyces vulgaris, IgG Negative  Negative  A. Pullulans Abs Negative  Negative  Thermoact. Saccharii Negative  Negative  Pigeon Serum Abs Negative  Negative    OV 09/22/2022  Subjective:  Patient ID: Judy Lucas, female , DOB: 1956/01/04 , age 85 y.o. , MRN:  [id number] , ADDRESS: [address] [place] [address] PCP Gable Cambric, MD Patient Care Team: Gable Cambric, MD as PCP - General (Internal Medicine)  This Provider for this visit: Treatment Team:  Attending Provider: Geronimo Amel, MD    09/22/2022 -   Chief Complaint  Patient presents with   Follow-up    Pft today.  Doing well.  No sx noted.     HPI Judy Lucas 69 y.o. -returns for follow-up of her interstitial lung abnormalities [ILA].  She continues to be asymptomatic.  Her husband is with her here today.  He is not an independent historian.  She says in the interim she is got some nonhealing left lower extremity ulcers.  She is going to see Dr. Melvenia and vascular vein specialist.  In addition October 2023 she had clindamycin for dental infection and had reaction that put her in the ER.  It is now an allergy but respiratory wise she is stable no other emergency room visits no hospitalizations.  No other medication changes.  She had a high-resolution CT chest there are some motion artifact but her interstitial lung abnormalities according to me is very minimal and stable.  Her pulmonary function test also is stable and is range bound.  The previous nodule is not well visualized.  She is up-to-date with her vaccines.  She did ask me what she could do to prevent her from developing ILD.  We talked about acid reflux control and preventing respiratory viruses.  She has seen Dr. Charlanne for this.     CT Chest data  CT Chest High Resolution  Result Date: 09/20/2022 CLINICAL DATA:  Lung nodule. EXAM: CT CHEST WITHOUT CONTRAST TECHNIQUE: Multidetector CT imaging of the chest was performed following the standard protocol without intravenous contrast. High resolution imaging of the lungs, as well as inspiratory and expiratory imaging, was performed. RADIATION DOSE REDUCTION: This exam was performed according to the departmental dose-optimization program which includes automated  exposure control, adjustment of the mA and/or kV according to patient size and/or use of iterative reconstruction technique. COMPARISON:  10/27/2021, 07/16/2020. FINDINGS: Cardiovascular: Atherosclerotic calcification of the aorta. Heart is enlarged. No pericardial effusion. Mediastinum/Nodes: No pathologically enlarged mediastinal or axillary lymph nodes. Hilar regions are difficult to definitively evaluate without IV contrast. Distal esophageal wall thickening can be seen in the setting of gastroesophageal reflux. Lungs/Pleura: Image quality is degraded by expiratory phase imaging. Subpleural ground-glass in the posterior lower lobes largely clears on  prone imaging. Mild subpleural scarring in the right lower lobe. Any further assessment for subtle interstitial lung disease is hindered by expiratory phase imaging. There is mild air trapping. No pleural fluid. Airway is unremarkable. Upper Abdomen: Visualized portions of the liver, adrenal glands, right kidney, spleen, pancreas, stomach and bowel are grossly unremarkable. No upper abdominal adenopathy. Musculoskeletal: Degenerative changes in the spine. No worrisome lytic or sclerotic lesions. IMPRESSION: 1. Image quality is markedly degraded by expiratory phase imaging, limiting the evaluation for subtle subpleural fibrosis, as suggested on comparison exams. 2. Mild air trapping is indicative of small airways disease. 3.  Aortic atherosclerosis (ICD10-I70.0). Electronically Signed   By: Newell Eke M.D.   On: 09/20/2022 15:08       OV 08/25/2023  Subjective:  Patient ID: Judy Lucas, female , DOB: Jan 05, 1956 , age 14 y.o. , MRN: [id number] , ADDRESS: [address] [place] [address] PCP Gable Cambric, MD Patient Care Team: Gable Cambric, MD as PCP - General (Internal Medicine)  This Provider for this visit: Treatment Team:  Attending Provider: Geronimo Amel, MD    08/25/2023 -   Chief Complaint  Patient presents with   Follow-up     PFT f/u, pt is complaining of lymph node (left side of chest) x3 yrs     HPI Judy Lucas 69 y.o. -1 year follow-up for lady with interstitial lung abnormalities.  She is opted for expectant follow-up.  For the calendar year 2024 Interim Health status: No new complaints No new medical problems. No new surgeries. No ER visits. No Urgent care visits. No changes to medications.  She is mostly sedentary although she does work around the house.  Because of her ankle issues she does not do much exercise.  But for the what work she does she reports no dyspnea cough or wheezing or chest pain orthopnea paroxysmal nocturnal dyspnea.  She had pulmonary function test today and it shows continued 1 year stability.  She again prefers to have an expectant surveillance approach with supportive care.     Modified Six Minute Walk - 09/22/22 1300     Type of O2 used  Room Air    Number of laps completed  3    Lap Pace Brisk    Resting Heartrate 55 bpm    Final Heartrate 78 bpm    Resting Pulse Ox 97 %    Desaturated to <= 3 points No    Desaturated to < 88% No    Became tachycardic No    Symptoms  No sx noted    comments Patient walked 3 laps with no difficulty          OV 08/20/2024  Subjective:  Patient ID: Judy Lucas, female , DOB: 09-08-1955 , age 6 y.o. , MRN: [id number] , ADDRESS: [address] [place] [address] PCP Silver Lamar LABOR, MD Patient Care Team: Silver Lamar LABOR, MD as PCP - General (Family Medicine)  This Provider for this visit: Treatment Team:  Attending Provider: Geronimo Amel, MD    08/20/2024 -   Chief Complaint  Patient presents with   Medical Management of Chronic Issues   ILA   pulmonary nodule    HRCT done 08/19/24. Doing well and denies any co's.    Follow-up lung nodule right lower lobe  Follow-up possible ILD Interstitial lung abnormality  -  pneumonia 15 years ago and COVID-19 in February 2020 and also repeat COVID-19 in  August/September 2021  - radiologist feels worse  since 2016 but stable 20210> 2022.   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  HPI Judy Lucas 69 y.o. -      Shortness of Breath 0 -> 5 scale with 5 being worst (score 6 If unable to do) 09/22/2022  08/20/2024    At rest 0 0   Simple tasks - showers, clothes change, eating, shaving 0 0   Household (dishes, doing bed, laundry) 0 0   Shopping 0 0   Walking level at own pace 0 0   Walking up Stairs 0 0   Total (30-36) Dyspnea Score 0 0       Non-dyspnea symptoms (0-> 5 scale) 09/30/2021 09/22/2022  08/20/2024    How bad is your cough? Mild due to allergies    How bad is your fatigue 0 0   How bad is nausea 0 0   How bad is vomiting?  00 0   How bad is diarrhea? 0 0   How bad is anxiety? 0 0   How bad is depression 0 0   Any chronic pain - if so where and how bad 0 0       Simple office walk 185 feet x  3 laps goal with forehead probe 09/30/2021    O2 used ra   Number laps completed 3   Comments about pace avg   Resting Pulse Ox/HR 100% and 51/min   Final Pulse Ox/HR 99% and 86/min   Desaturated </= 88% no   Desaturated <= 3% points no   Got Tachycardic >/= 90/min no   Symptoms at end of test none   Miscellaneous comments Nomal tste      PFT     Latest Ref Rng & Units 08/20/2024   10:35 AM 08/25/2023    9:43 AM 09/22/2022   12:44 PM 09/30/2021    9:52 AM 09/29/2020    8:55 AM  PFT Results  FVC-Pre L 2.33  P 2.29  2.17  2.43  2.30   FVC-Predicted Pre % 79  P 77  73  81  76   FVC-Post L     2.45   FVC-Predicted Post %     81   Pre FEV1/FVC % % 85  P 83  87  87  86   Post FEV1/FCV % %     87   FEV1-Pre L 1.98  P 1.91  1.90  2.12  1.97   FEV1-Predicted Pre % 89  P 85  84  93  85   FEV1-Post L     2.15   DLCO uncorrected ml/min/mmHg 16.91  P 17.90  17.30  21.06  18.86   DLCO UNC% % 89  P 94  91  110  98   DLCO corrected  ml/min/mmHg  17.90  17.30  21.06  18.86   DLCO COR %Predicted %  94  91  110  98   DLVA Predicted % 110  P 116  111  127  122   TLC L     3.79   TLC % Predicted %     77   RV % Predicted %     71     P Preliminary result       LAB RESULTS last 96 hours No results found.       has a past medical history of Abnormal CT of the chest (08/27/2019), Achilles tendinitis of  left lower extremity (05/02/2022), Allergic rhinitis, Allergy, Anxiety, Chest discomfort (02/20/2019), Chest pain (01/22/2019), Choline deficiency, Diverticulosis, Dysphagia (01/22/2019), Heel spur, left (05/02/2022), Hyperlipidemia, Hypertension, Morbid obesity (HCC) (01/22/2019), Osteoarthritis (01/22/2019), Pharyngoesophageal dysphagia (01/22/2019), Pneumonia (05/2015), Renal insufficiency (01/22/2019), Sensorineural hearing loss (SNHL) of both ears (02/24/2021), Tenosynovitis of left ankle (06/07/2022), Tenosynovitis of right ankle (06/07/2022), and Tightness of heel cord, left (05/02/2022).   reports that she has never smoked. She has never used smokeless tobacco.  Past Surgical History:  Procedure Laterality Date   ABDOMINAL HYSTERECTOMY     APPENDECTOMY     CHOLECYSTECTOMY     COLONOSCOPY  10/14/2015   Colonic polyp status post polypectomy. Predominantly sigmoid diverticulosis   COLONOSCOPY  05/31/2019   Dr Towana  Benign neoplasm of cecum. Repeat in 5 years   ENDOVENOUS ABLATION SAPHENOUS VEIN W/ LASER Left 10/17/2019   endovenous laser ablation anterior accessory branch of left greater saphenous vein by Medford Blade MD    ENDOVENOUS ABLATION SAPHENOUS VEIN W/ LASER Left 10/05/2022   endovenous laser ablation left small saphenous vein by Medford Blade MD   ESOPHAGOGASTRODUODENOSCOPY  05/31/2019   Dr Towana. There was no esophagitis. Multiple biopsies obtained from the esophagus to evaluate for eosinophilic esophagitis. Normal Z-Line. The EG junction did not distend well and had increased spasm. A wide bore  stricture or ring could have been missed due to lack of distension but there was no obvious stricture.   feet surgery     LASER ABLATION Left 05/16/2024   Laser ablation of the left greater saphenous vein and left anterior accessory saphenous vein   WRIST SURGERY      Allergies[1]  Immunization History  Administered Date(s) Administered   Fluad Quad(high Dose 65+) 05/27/2020   INFLUENZA, HIGH DOSE SEASONAL PF 05/27/2021, 04/27/2023, 05/06/2024   Influenza,inj,Quad PF,6-35 Mos 05/16/2019   Influenza-Unspecified 05/30/2022   PFIZER(Purple Top)SARS-COV-2 Vaccination 09/09/2019, 09/23/2019, 07/22/2020   Pneumococcal Polysaccharide-23 09/08/2020   Respiratory Syncytial Virus Vaccine,Recomb Aduvanted(Arexvy) 06/22/2022   Zoster Recombinant(Shingrix) 05/14/2018    Family History  Problem Relation Age of Onset   Hyperlipidemia Mother    Hypertension Mother    Heart disease Mother    Heart attack Mother    Skin cancer Father    Hyperlipidemia Father    Hypertension Father    Heart disease Father    Heart attack Father    Hypertension Sister    Hyperlipidemia Sister    Hyperlipidemia Brother     Current Medications[2]      Objective:   Vitals:   08/20/24 1402  BP: 118/62  Pulse: (!) 58  SpO2: 97%  Weight: 243 lb (110.2 kg)  Height: 5' 3 (1.6 m)    Estimated body mass index is 43.05 kg/m as calculated from the following:   Height as of this encounter: 5' 3 (1.6 m).   Weight as of this encounter: 243 lb (110.2 kg).  @WEIGHTCHANGE @  [employer]   08/20/24 1402  Weight: 243 lb (110.2 kg)     Physical Exam   General: No distress. Looks well O2 at rest: no Cane present: no Sitting in wheel chair: no Frail: no Obese: YES Neuro: Alert and Oriented x 3. GCS 15. Speech normal Psych: Pleasant Resp:  Barrel Chest - no.  Wheeze - no, Crackles - no, No overt respiratory distress CVS: Normal heart sounds. Murmurs - no Ext: Stigmata of Connective Tissue Disease  - no HEENT: Normal upper airway. PEERL +. No post nasal drip  Assessment/     Assessment & Plan Interstitial lung abnormality (ILA)  Anti-RNP antibodies present  Solitary pulmonary nodule    PLAN Patient Instructions  Solitary pulmonary nodule  -15 mm nodule right costophrenic angle stable between December 2020 in December 2021. NO change in Dec 2022 CT. Not easily visible Feb 2024 CT  Plan - await reuslts on Jan 2026 CT chest  Interstitial lung abnormalities December 2021 - > Jan 2026 Personal history of COVID-19 History of community acquired pneumonia remote RNP antibiody positive P-ANCA positive  -There is  ver very mild scar tissue in the bottom of the lung.  This probably reflects admission for pneumonia 15 years ago and COVID-19 in February 2020 and also repeat COVID-19 in August/September 2021. We call this interstitial lung abnormality  - Curently asymptomatic with normal PFT - CT chest results 08/19/24 pending  Plan - will releast Jan 2026 results to you   -if getting worse will see you sooner or else do PFt in 1 year and then decide on next CT - surveillance approach  - avoiding biopsy or empiric treatment approach -spiro/dlco - Jan 2027 (1 year) -  Follow-up = cancl pending JAn 2026 appt - return in 1 year  for  15 min visit but after PFT   - simple walk test at followup    FOLLOWUP    Return in about 1 year (around 08/20/2025) for 15 min visit, after Spiro and DLCO, with Dr Geronimo, with any of the APPS.    SIGNATURE    Dr. Dorethia Geronimo, M.D., F.C.C.P,  Pulmonary and Critical Care Medicine Staff Physician, [facility] Director - Interstitial Lung Disease  Program  Pulmonary Fibrosis [facility] at [facility] [address] [place], [place], [address]  Pager: [phone], If no answer or between  15:00h - 7:00h: call [phone] Telephone: [phone]  2:32 PM 08/20/2024   Moderate Complexity MDM  OFFICE  2021 E/M guidelines, first released in 2021, with minor revisions added in 2023 and 2024 Must meet the requirements for 2 out of 3 dimensions to qualify.    Number and complexity of problems addressed Amount and/or complexity of data reviewed Risk of complications and/or morbidity  One or more chronic illness with mild exacerbation, OR progression, OR  side effects of treatment  Two or more stable chronic illnesses  One undiagnosed new problem with uncertain prognosis  One acute illness with systemic symptoms   One Acute complicated injury Must meet the requirements for 1 of 3 of the categories)  Category 1: Tests and documents, historian  Any combination of 3 of the following:  Assessment requiring an independent historian  Review of prior external note(s) from each unique source  Review of results of each unique test  Ordering of each unique test    Category 2: Interpretation of tests   Independent interpretation of a test performed by another physician/other qualified health care professional (not separately reported)  Category 3: Discuss management/tests  Discussion of management or test interpretation with external physician/other qualified health care professional/appropriate source (not separately reported) Moderate risk of morbidity from additional diagnostic testing or treatment Examples only:  Prescription drug management  Decision regarding minor surgery with identfied patient or procedure risk factors  Decision regarding elective major surgery without identified patient or procedure risk factors  Diagnosis or treatment significantly limited by social determinants of health             HIGh  Complexity  OFFICE   2021 E/M guidelines, first released in 2021, with minor revisions added in 2023. Must meet the requirements for 2 out of 3 dimensions to qualify.    Number and complexity of problems addressed Amount and/or complexity of data  reviewed Risk of complications and/or morbidity  Severe exacerbation of chronic illness  Acute or chronic illnesses that may pose a threat to life or bodily function, e.g., multiple trauma, acute MI, pulmonary embolus, severe respiratory distress, progressive rheumatoid arthritis, psychiatric illness with potential threat to self or others, peritonitis, acute renal failure, abrupt change in neurological status Must meet the requirements for 2 of 3 of the categories)  Category 1: Tests and documents, historian  Any combination of 3 of the following:  Assessment requiring an independent historian  Review of prior external note(s) from each unique source  Review of results of each unique test  Ordering of each unique test    Category 2: Interpretation of tests    Independent interpretation of a test performed by another physician/other qualified health care professional (not separately reported)  Category 3: Discuss management/tests  Discussion of management or test interpretation with external physician/other qualified health care professional/appropriate source (not separately reported)  HIGH risk of morbidity from additional diagnostic testing or treatment Examples only:  Drug therapy requiring intensive monitoring for toxicity  Decision for elective major surgery with identified pateint or procedure risk factors  Decision regarding hospitalization or escalation of level of care  Decision for DNR or to de-escalate care   Parenteral controlled  substances            LEGEND - Independent interpretation involves the interpretation of a test for which there is a CPT code, and an interpretation or report is customary. When a review and interpretation of a test is performed and documented by the provider, but not separately reported (billed), then this would represent an independent interpretation. This report does not need to conform to the usual standards of a complete  report of the test. This does not include interpretation of tests that do not have formal reports such as a complete blood count with differential and blood cultures. Examples would include reviewing a chest radiograph and documenting in the medical record an interpretation, but not separately reporting (billing) the interpretation of the chest radiograph.   An appropriate source includes professionals who are not health care professionals but may be involved in the management of the patient, such as a clinical research associate, upper officer, case manager or teacher, and does not include discussion with family or informal caregivers.    - SDOH: SDOH are the conditions in the environments where people are born, live, learn, work, play, worship, and age that affect a wide range of health, functioning, and quality-of-life outcomes and risks. (e.g., housing, food insecurity, transportation, etc.). SDOH-related Z codes ranging from Z55-Z65 are the ICD-10-CM diagnosis codes used to document SDOH data Z55 - Problems related to education and literacy Z56 - Problems related to employment and unemployment Z57 - Occupational exposure to risk factors Z58 - Problems related to physical environment Z59 - Problems related to housing and economic circumstances [phone] - Problems related to social environment [phone] - Problems related to upbringing [phone] - Other problems related to primary support group, including family circumstances Z18 - Problems related to certain psychosocial circumstances Z65 - Problems related to other psychosocial circumstances     [1]  Allergies Allergen Reactions   Meperidine Shortness Of Breath   Morphine Shortness Of Breath  Sulfamethoxazole Rash   Thyroid  Swelling   Yellow Dyes 6, 10, And 11 Anaphylaxis   Clindamycin Rash    Significant erythroderma with the itching associated with clindamycin.  No swelling, wheezing, dyspnea, or airway compromise.   Furosemide Rash    SWELLING ALLERGY     Nitrofurantoin Rash   Nitroglycerin  Palpitations   Penicillin G Rash  [2]  Current Outpatient Medications:    albuterol (VENTOLIN HFA) 108 (90 Base) MCG/ACT inhaler, Inhale 2 puffs into the lungs every 4 (four) hours as needed for wheezing or shortness of breath., Disp: , Rfl:    aspirin EC 81 MG tablet, Take 81 mg by mouth daily., Disp: , Rfl:    Cholecalciferol (VITAMIN D3) 50 MCG (2000 UT) capsule, Take 2,000 Units by mouth daily., Disp: , Rfl:    diazepam  (VALIUM ) 5 MG tablet, Take 1 tablet 30 minutes prior to leaving house on day of office surgery., Disp: 1 tablet, Rfl: 0   fluticasone (FLONASE) 50 MCG/ACT nasal spray, Place 2 sprays into both nostrils daily., Disp: , Rfl:    loratadine (CLARITIN) 10 MG tablet, Take 1 tablet by mouth daily., Disp: , Rfl:    LORazepam  (ATIVAN ) 1 MG tablet, Take 2 tablets 30 to 60 minutes prior to leaving house on day of office surgery.  Bring third tablet with you to office on day of office surgery., Disp: 3 tablet, Rfl: 0   meclizine (ANTIVERT) 12.5 MG tablet, Take 12.5 mg by mouth 3 (three) times daily as needed for dizziness., Disp: , Rfl:    montelukast (SINGULAIR) 10 MG tablet, Take 10 mg by mouth at bedtime., Disp: , Rfl:    olmesartan (BENICAR) 20 MG tablet, Take 20 mg by mouth daily., Disp: , Rfl:    omeprazole (PRILOSEC) 20 MG capsule, Take 20 mg by mouth daily., Disp: , Rfl:    oxybutynin (DITROPAN-XL) 10 MG 24 hr tablet, Take 10 mg by mouth daily., Disp: , Rfl:    simvastatin (ZOCOR) 10 MG tablet, Take 10 mg by mouth daily., Disp: , Rfl:    spironolactone (ALDACTONE) 25 MG tablet, Take 25 mg by mouth daily., Disp: , Rfl:   "

## 2024-09-10 ENCOUNTER — Ambulatory Visit: Admitting: Internal Medicine

## 2024-09-13 ENCOUNTER — Ambulatory Visit: Payer: Self-pay | Admitting: Internal Medicine

## 2024-09-16 ENCOUNTER — Other Ambulatory Visit: Payer: Self-pay

## 2024-09-16 DIAGNOSIS — L97329 Non-pressure chronic ulcer of left ankle with unspecified severity: Secondary | ICD-10-CM

## 2024-09-17 ENCOUNTER — Telehealth: Payer: Self-pay | Admitting: Cardiology

## 2024-09-17 NOTE — Telephone Encounter (Signed)
 Pt requesting a provider switch to Dr. Krasowski. Please advise.

## 2024-10-09 ENCOUNTER — Ambulatory Visit (HOSPITAL_COMMUNITY)

## 2024-10-09 ENCOUNTER — Ambulatory Visit: Admitting: Vascular Surgery
